# Patient Record
Sex: Female | Born: 1975 | Hispanic: No | Marital: Married | Smoking: Former smoker
Health system: Southern US, Community
[De-identification: ages and names within clinical notes are randomized; demographics above are authoritative.]

## PROBLEM LIST (undated history)

## (undated) ENCOUNTER — Emergency Department (HOSPITAL_COMMUNITY): Payer: Medicaid Other | Source: Home / Self Care

## (undated) ENCOUNTER — Emergency Department (HOSPITAL_COMMUNITY): Admission: EM | Payer: Medicaid Other | Source: Home / Self Care

## (undated) DIAGNOSIS — R519 Headache, unspecified: Secondary | ICD-10-CM

## (undated) DIAGNOSIS — K259 Gastric ulcer, unspecified as acute or chronic, without hemorrhage or perforation: Secondary | ICD-10-CM

## (undated) DIAGNOSIS — F419 Anxiety disorder, unspecified: Secondary | ICD-10-CM

## (undated) DIAGNOSIS — K219 Gastro-esophageal reflux disease without esophagitis: Secondary | ICD-10-CM

## (undated) DIAGNOSIS — K589 Irritable bowel syndrome without diarrhea: Secondary | ICD-10-CM

## (undated) DIAGNOSIS — R51 Headache: Secondary | ICD-10-CM

## (undated) DIAGNOSIS — M21619 Bunion of unspecified foot: Secondary | ICD-10-CM

## (undated) HISTORY — DX: Irritable bowel syndrome, unspecified: K58.9

## (undated) HISTORY — DX: Headache, unspecified: R51.9

## (undated) HISTORY — DX: Anxiety disorder, unspecified: F41.9

## (undated) HISTORY — DX: Bunion of unspecified foot: M21.619

## (undated) HISTORY — PX: FOOT SURGERY: SHX648

## (undated) HISTORY — DX: Headache: R51

---

## 2012-07-20 ENCOUNTER — Emergency Department (HOSPITAL_COMMUNITY): Payer: Medicaid Other

## 2012-07-20 ENCOUNTER — Encounter (HOSPITAL_COMMUNITY): Payer: Self-pay | Admitting: *Deleted

## 2012-07-20 ENCOUNTER — Emergency Department (HOSPITAL_COMMUNITY)
Admission: EM | Admit: 2012-07-20 | Discharge: 2012-07-20 | Disposition: A | Discharge status: Home / Self Care | Payer: Medicaid Other | Attending: Emergency Medicine | Admitting: Emergency Medicine

## 2012-07-20 DIAGNOSIS — R10812 Left upper quadrant abdominal tenderness: Secondary | ICD-10-CM | POA: Insufficient documentation

## 2012-07-20 DIAGNOSIS — R112 Nausea with vomiting, unspecified: Secondary | ICD-10-CM

## 2012-07-20 DIAGNOSIS — R109 Unspecified abdominal pain: Secondary | ICD-10-CM | POA: Insufficient documentation

## 2012-07-20 HISTORY — DX: Gastric ulcer, unspecified as acute or chronic, without hemorrhage or perforation: K25.9

## 2012-07-20 HISTORY — DX: Gastro-esophageal reflux disease without esophagitis: K21.9

## 2012-07-20 LAB — COMPREHENSIVE METABOLIC PANEL
AST: 10 U/L (ref 0–37)
Albumin: 3.2 g/dL — ABNORMAL LOW (ref 3.5–5.2)
BUN: 9 mg/dL (ref 6–23)
Calcium: 8.8 mg/dL (ref 8.4–10.5)
Chloride: 110 mEq/L (ref 96–112)
Creatinine, Ser: 0.77 mg/dL (ref 0.50–1.10)
GFR calc non Af Amer: >90 mL/min (ref >90)
Total Bilirubin: 0.5 mg/dL (ref 0.3–1.2)

## 2012-07-20 LAB — CBC WITH DIFFERENTIAL/PLATELET
Hemoglobin: 12 g/dL (ref 12.0–15.0)
Lymphocytes Relative: 49 % — ABNORMAL HIGH (ref 12–46)
Lymphs Abs: 4.7 10*3/uL — ABNORMAL HIGH (ref 0.7–4.0)
MCH: 31.6 pg (ref 26.0–34.0)
MCV: 89.7 fL (ref 78.0–100.0)
Monocytes Relative: 6 % (ref 3–12)
Neutrophils Relative %: 44 % (ref 43–77)
Platelets: 264 10*3/uL (ref 150–400)
RBC: 3.8 MIL/uL — ABNORMAL LOW (ref 3.87–5.11)
WBC: 9.6 10*3/uL (ref 4.0–10.5)

## 2012-07-20 LAB — URINALYSIS, ROUTINE W REFLEX MICROSCOPIC
Ketones, ur: NEGATIVE mg/dL
Leukocytes, UA: NEGATIVE
Protein, ur: NEGATIVE mg/dL
Urobilinogen, UA: 1 mg/dL (ref 0.0–1.0)

## 2012-07-20 LAB — POCT PREGNANCY, URINE: Preg Test, Ur: NEGATIVE

## 2012-07-20 LAB — LIPASE, BLOOD: Lipase: 25 U/L (ref 11–59)

## 2012-07-20 MED ORDER — IOHEXOL 300 MG/ML  SOLN
100.0000 mL | Freq: Once | INTRAMUSCULAR | Status: AC | PRN
  Administered 2012-07-20: 100 mL via INTRAVENOUS

## 2012-07-20 MED ORDER — ONDANSETRON 4 MG PO TBDP
8.0000 mg | ORAL_TABLET | Freq: Once | ORAL | Status: AC
  Administered 2012-07-20: 8 mg via ORAL
  Filled 2012-07-20: qty 2

## 2012-07-20 MED ORDER — SODIUM CHLORIDE 0.9 % IV BOLUS (SEPSIS)
1000.0000 mL | Freq: Once | INTRAVENOUS | Status: AC
  Administered 2012-07-20: 1000 mL via INTRAVENOUS

## 2012-07-20 MED ORDER — HYDROMORPHONE HCL PF 1 MG/ML IJ SOLN
1.0000 mg | Freq: Once | INTRAMUSCULAR | Status: AC
  Administered 2012-07-20: 1 mg via INTRAVENOUS
  Filled 2012-07-20: qty 1

## 2012-07-20 MED ORDER — IOHEXOL 300 MG/ML  SOLN
20.0000 mL | INTRAMUSCULAR | Status: AC
  Administered 2012-07-20: 20 mL via ORAL

## 2012-07-20 MED ORDER — ONDANSETRON HCL 8 MG PO TABS: 8.0000 mg | ORAL_TABLET | Freq: Three times a day (TID) | ORAL | Status: AC | PRN

## 2012-07-20 MED ORDER — OXYCODONE-ACETAMINOPHEN 5-325 MG PO TABS
1.0000 | ORAL_TABLET | Freq: Once | ORAL | Status: AC
  Administered 2012-07-20: 1 via ORAL
  Filled 2012-07-20: qty 1

## 2012-07-20 MED ORDER — ONDANSETRON HCL 4 MG/2ML IJ SOLN
4.0000 mg | Freq: Once | INTRAMUSCULAR | Status: AC
  Administered 2012-07-20: 4 mg via INTRAVENOUS
  Filled 2012-07-20: qty 2

## 2012-07-20 MED ORDER — OXYCODONE-ACETAMINOPHEN 5-325 MG PO TABS: 1.0000 | ORAL_TABLET | ORAL | Status: AC | PRN

## 2012-07-20 MED ORDER — DIPHENHYDRAMINE HCL 50 MG/ML IJ SOLN
25.0000 mg | Freq: Once | INTRAMUSCULAR | Status: AC
  Administered 2012-07-20: 25 mg via INTRAVENOUS
  Filled 2012-07-20: qty 1

## 2012-07-20 MED ORDER — SODIUM CHLORIDE 0.9 % IV SOLN
INTRAVENOUS | Status: DC
  Administered 2012-07-20: 04:00:00 via INTRAVENOUS

## 2012-07-20 NOTE — ED Provider Notes (Signed)
History     CSN: [id number]  Arrival date & time 07/20/12  0145   First MD Initiated Contact with Patient 07/20/12 [phone]      Chief Complaint  Patient presents with  . Abdominal Pain    (Consider location/radiation/quality/duration/timing/severity/associated sxs/prior treatment) HPI Comments: Rachael Moyer is a 36 y.o. Female who has had left upper abdominal pain, with nausea and vomiting, but no diarrhea, for 3 days. She's not tried medication for the problem. She's unable to tolerate any liquids or food. She's never had this before. She does have a history of constipation. She's not had a fever, chills, cough, shortness of breath, or chest pain. There has been no change in urinary habits. There are no other aggravating or palliative factors  Patient is a 36 y.o. female presenting with abdominal pain. The history is provided by the patient.  Abdominal Pain The primary symptoms of the illness include abdominal pain.    Past Medical History  Diagnosis Date  . Gastric ulcer   . GERD (gastroesophageal reflux disease)     History reviewed. No pertinent past surgical history.  History reviewed. No pertinent family history.  History  Substance Use Topics  . Smoking status: Current Everyday Smoker  . Smokeless tobacco: Not on file  . Alcohol Use: Yes    OB History    Grav Para Term Preterm Abortions TAB SAB Ect Mult Living                  Review of Systems  Gastrointestinal: Positive for abdominal pain.  All other systems reviewed and are negative.    Allergies  Review of patient's allergies indicates no known allergies.  Home Medications   Current Outpatient Rx  Name Route Sig Dispense Refill  . ONDANSETRON HCL 8 MG PO TABS Oral Take 1 tablet (8 mg total) by mouth every 8 (eight) hours as needed for nausea. 20 tablet 0  . OXYCODONE-ACETAMINOPHEN 5-325 MG PO TABS Oral Take 1 tablet by mouth every 4 (four) hours as needed for pain. 20 tablet 0    BP 107/71   Pulse 66  Temp 97 F (36.1 C) (Oral)  Resp 18  SpO2 100%  LMP 07/13/2012  Physical Exam  Nursing note and vitals reviewed. Constitutional: She is oriented to person, place, and time. She appears well-developed and well-nourished.  HENT:  Head: Normocephalic and atraumatic.  Eyes: Conjunctivae and EOM are normal. Pupils are equal, round, and reactive to light.  Neck: Normal range of motion and phonation normal. Neck supple.  Cardiovascular: Normal rate, regular rhythm and intact distal pulses.   Pulmonary/Chest: Effort normal and breath sounds normal. She exhibits no tenderness.  Abdominal: Soft. She exhibits no distension. There is tenderness (moderate left upper quadrant). There is no guarding.  Musculoskeletal: Normal range of motion.  Neurological: She is alert and oriented to person, place, and time. She has normal strength. She exhibits normal muscle tone.  Skin: Skin is warm and dry.  Psychiatric: She has a normal mood and affect. Her behavior is normal. Judgment and thought content normal.    ED Course  Procedures (including critical care time)  Emergency department treatment: IV fluids, IV, Dilaudid, and Zofran.  Reevaluation; 07:55- she has tolerated oral contrast without vomiting and feels somewhat better.  She would like another dose of medicine prior to leaving. She is given Percocet, and Zofran.  Labs Reviewed  URINALYSIS, ROUTINE W REFLEX MICROSCOPIC - Abnormal; Notable for the following:    APPearance CLOUDY (*)  Specific Gravity, Urine 1.031 (*)     All other components within normal limits  COMPREHENSIVE METABOLIC PANEL - Abnormal; Notable for the following:    Albumin 3.2 (*)     All other components within normal limits  CBC WITH DIFFERENTIAL - Abnormal; Notable for the following:    RBC 3.80 (*)     HCT 34.1 (*)     Lymphocytes Relative 49 (*)     Lymphs Abs 4.7 (*)     All other components within normal limits  POCT PREGNANCY, URINE  LIPASE,  BLOOD   Ct Abdomen Pelvis W Contrast  07/20/2012  *RADIOLOGY REPORT*  Clinical Data: Abdominal pain and vomiting.  CT ABDOMEN AND PELVIS WITH CONTRAST  Technique:  Multidetector CT imaging of the abdomen and pelvis was performed following the standard protocol during bolus administration of intravenous contrast.  Contrast: OMNIPAQUE IOHEXOL 300 MG/ML  SOLN  Comparison: No priors.  Findings:  Lung Bases: Minimal dependent subsegmental atelectasis in the lower lobes of the lungs bilaterally.  Otherwise, unremarkable.  Abdomen/Pelvis:  There is a subtle area of ill-defined decreased attenuation in segment 4B of the liver adjacent to the falciform ligament, favored to represent a small perfusion anomaly.  The remainder the liver is otherwise unremarkable in appearance.  The enhanced appearance of the gallbladder, pancreas, spleen and bilateral adrenal glands is unremarkable.  A 7 mm low attenuation lesion in left kidney is too small to definitively characterize. 12 x 18 mm low attenuation lesion in the upper pole of the right kidney is compatible with a cyst.  Trace volume of free fluid the cul-de-sac is presumably physiologic in this young female patient.  No larger volume of ascites.  No pneumoperitoneum.  No pathologic distension of bowel.  The appendix is normal.  Uterus and right ovary are unremarkable.  Multiple small follicles are incidentally noted in the left ovary.  Urinary bladder is unremarkable in appearance.  Musculoskeletal: There are no aggressive appearing lytic or blastic lesions noted in the visualized portions of the skeleton.  IMPRESSION: 1.  No definite acute findings in the abdomen or pelvis to account the patient's symptoms. 2.  Specifically, the appendix is normal. 3.  There is a ill-defined nonspecific low attenuation area in segment 4B of the liver adjacent the falciform ligament that is strongly favored to represent a benign perfusion anomaly.  The does not appear consistent with a  focal mass, and is highly unlikely to be related to the patient's acute symptoms.  This would require hepatic MRI with IV gadolinium for definitive characterization, however, this is not likely to be warranted. 3.  Low-attenuation renal lesions bilaterally.  Lesion on the right is compatible with a simple cyst, while the lesion on the left is too small to definitively characterize (but also likely to represent a cyst). 4.  Trace volume of free fluid the cul-de-sac is presumably physiologic in this young female patient.   Original Report Authenticated By: Florencia Reasons, M.D.      1. Abdominal pain of unknown etiology   2. Nausea & vomiting       MDM  Nonspecific abdominal pain, with pressure. A CT of the abdomen or pelvis. Doubt metabolic instability, serious bacterial infection or impending vascular collapse; the patient is stable for discharge.  She is instructed about the mild abnormality seen on CT scan testing that can be followed up as an outpatient with the Dr. of her choice.    Plan: Home Medications- Percocet and  Zofran; Home Treatments- gradually advance diet; Recommended follow up- PCP of choice 1-3 weks.     Flint Melter, MD 07/20/12 [phone]

## 2012-07-20 NOTE — ED Notes (Signed)
Patient currently sitting up in bed; no respiratory or acute distress noted.  Patient finished drinking contrast/water.  CT contacted and notified.  Patient has no other questions or concerns at this time; will continue to monitor.

## 2012-07-20 NOTE — ED Notes (Addendum)
Received bedside report from [place], [place].  Patient currently requesting pain medication; EDP aware.  Patient has no other questions or concerns at this time; will continue to monitor.

## 2012-07-20 NOTE — ED Notes (Signed)
Pt had her CT and results are in the chart.

## 2012-07-20 NOTE — ED Notes (Signed)
Pt states that 3 days ago she has been having abdominal pain and vomiting. Pt states she woke up today with more severe pain in her upper portion of her abdomen. Pt states that she ddi vomit after she woke up and has been attempting to eat broth and soup. Pt states she hasn't been to the bathroom in a week. Pt denies problems with urine

## 2012-07-20 NOTE — ED Notes (Signed)
Patient currently sitting up in bed; no respiratory or acute distress noted.  Patient still drinking contrast/water at this time; pending for CT.  Patient has no other questions or concerns at this time; will continue to monitor.

## 2013-01-24 ENCOUNTER — Emergency Department (HOSPITAL_COMMUNITY)
Admission: EM | Admit: 2013-01-24 | Discharge: 2013-01-24 | Disposition: A | Discharge status: Home / Self Care | Payer: Medicaid Other | Attending: Emergency Medicine | Admitting: Emergency Medicine

## 2013-01-24 ENCOUNTER — Emergency Department (HOSPITAL_COMMUNITY): Payer: Medicaid Other

## 2013-01-24 ENCOUNTER — Encounter (HOSPITAL_COMMUNITY): Payer: Self-pay | Admitting: Emergency Medicine

## 2013-01-24 DIAGNOSIS — R1310 Dysphagia, unspecified: Secondary | ICD-10-CM | POA: Insufficient documentation

## 2013-01-24 DIAGNOSIS — Z8711 Personal history of peptic ulcer disease: Secondary | ICD-10-CM | POA: Insufficient documentation

## 2013-01-24 DIAGNOSIS — J029 Acute pharyngitis, unspecified: Secondary | ICD-10-CM | POA: Insufficient documentation

## 2013-01-24 DIAGNOSIS — J209 Acute bronchitis, unspecified: Secondary | ICD-10-CM | POA: Insufficient documentation

## 2013-01-24 DIAGNOSIS — F17200 Nicotine dependence, unspecified, uncomplicated: Secondary | ICD-10-CM | POA: Insufficient documentation

## 2013-01-24 DIAGNOSIS — R059 Cough, unspecified: Secondary | ICD-10-CM | POA: Insufficient documentation

## 2013-01-24 DIAGNOSIS — R51 Headache: Secondary | ICD-10-CM | POA: Insufficient documentation

## 2013-01-24 DIAGNOSIS — R61 Generalized hyperhidrosis: Secondary | ICD-10-CM | POA: Insufficient documentation

## 2013-01-24 DIAGNOSIS — Z8719 Personal history of other diseases of the digestive system: Secondary | ICD-10-CM | POA: Insufficient documentation

## 2013-01-24 LAB — CBC WITH DIFFERENTIAL/PLATELET
Basophils Absolute: 0 10*3/uL (ref 0.0–0.1)
Basophils Relative: 0 % (ref 0–1)
Eosinophils Absolute: 0 10*3/uL (ref 0.0–0.7)
Eosinophils Relative: 0 % (ref 0–5)
HCT: 37.7 % (ref 36.0–46.0)
Hemoglobin: 13.8 g/dL (ref 12.0–15.0)
Lymphocytes Relative: 28 % (ref 12–46)
Lymphs Abs: 1.7 10*3/uL (ref 0.7–4.0)
MCH: 32.8 pg (ref 26.0–34.0)
MCHC: 36.6 g/dL — ABNORMAL HIGH (ref 30.0–36.0)
MCV: 89.5 fL (ref 78.0–100.0)
Monocytes Absolute: 0.8 10*3/uL (ref 0.1–1.0)
Monocytes Relative: 13 % — ABNORMAL HIGH (ref 3–12)
Neutro Abs: 3.6 10*3/uL (ref 1.7–7.7)
Neutrophils Relative %: 59 % (ref 43–77)
Platelets: 279 10*3/uL (ref 150–400)
RBC: 4.21 MIL/uL (ref 3.87–5.11)
RDW: 12 % (ref 11.5–15.5)
WBC: 6.1 10*3/uL (ref 4.0–10.5)

## 2013-01-24 LAB — POCT I-STAT, CHEM 8
BUN: 7 mg/dL (ref 6–23)
Calcium, Ion: 1.19 mmol/L (ref 1.12–1.23)
Chloride: 105 mEq/L (ref 96–112)
Creatinine, Ser: 0.9 mg/dL (ref 0.50–1.10)
Glucose, Bld: 88 mg/dL (ref 70–99)
HCT: 40 % (ref 36.0–46.0)
Hemoglobin: 13.6 g/dL (ref 12.0–15.0)
Potassium: 3.4 mEq/L — ABNORMAL LOW (ref 3.5–5.1)
Sodium: 141 mEq/L (ref 135–145)
TCO2: 26 mmol/L (ref 0–100)

## 2013-01-24 MED ORDER — AZITHROMYCIN 250 MG PO TABS: 250.0000 mg | ORAL_TABLET | Freq: Every day | ORAL | Status: DC

## 2013-01-24 MED ORDER — ALBUTEROL SULFATE HFA 108 (90 BASE) MCG/ACT IN AERS
2.0000 | INHALATION_SPRAY | Freq: Once | RESPIRATORY_TRACT | Status: AC
  Administered 2013-01-24: 2 via RESPIRATORY_TRACT
  Filled 2013-01-24: qty 6.7

## 2013-01-24 MED ORDER — BENZONATATE 100 MG PO CAPS: 200.0000 mg | ORAL_CAPSULE | Freq: Two times a day (BID) | ORAL | Status: DC | PRN

## 2013-01-24 NOTE — ED Provider Notes (Addendum)
History     CSN: [id number]  Arrival date & time 01/24/13  1348   First MD Initiated Contact with Patient 01/24/13 1601      Chief Complaint  Patient presents with  . Chest Pain  . Sore Throat    (Consider location/radiation/quality/duration/timing/severity/associated sxs/prior treatment) HPI Comments: 37 year old female who presents with her second and cough. She states that her symptoms started yesterday, gradually worsening, persistent, associated with diaphoresis, difficulty swallowing because of sore throat, headache, chest pain which feels like a tightness, worse when she breathes and coughs. She has recently started a new job at [employer], she does not have any other new sick contacts, has not recently been on antibiotics and otherwise is healthy without any significant medical problems. Nothing seems to make this better or worse  The history is provided by the patient.    Past Medical History  Diagnosis Date  . Gastric ulcer   . GERD (gastroesophageal reflux disease)     Past Surgical History  Procedure Laterality Date  . Foot surgery      No family history on file.  History  Substance Use Topics  . Smoking status: Current Every Day Smoker  . Smokeless tobacco: Not on file  . Alcohol Use: Yes    OB History   Grav Para Term Preterm Abortions TAB SAB Ect Mult Living                  Review of Systems  All other systems reviewed and are negative.    Allergies  Review of patient's allergies indicates no known allergies.  Home Medications   Current Outpatient Rx  Name  Route  Sig  Dispense  Refill  . Aspirin-Acetaminophen-Caffeine (EXCEDRIN PO)   Oral   Take 2 tablets by mouth daily as needed. For pain         . azithromycin (ZITHROMAX Z-PAK) 250 MG tablet   Oral   Take 1 tablet (250 mg total) by mouth daily. 500mg  PO day 1, then 250mg  PO days 205   6 tablet   0   . benzonatate (TESSALON) 100 MG capsule   Oral   Take 2 capsules  (200 mg total) by mouth 2 (two) times daily as needed for cough.   20 capsule   0     BP 109/83  Pulse 106  Temp(Src) 98.8 F (37.1 C) (Oral)  Resp 20  SpO2 97%  LMP 01/24/2013  Physical Exam  Nursing note and vitals reviewed. Constitutional: She appears well-developed and well-nourished. No distress.  HENT:  Head: Normocephalic and atraumatic.  Mouth/Throat: Oropharynx is clear and moist. No oropharyngeal exudate.  Eyes: Conjunctivae and EOM are normal. Pupils are equal, round, and reactive to light. Right eye exhibits no discharge. Left eye exhibits no discharge. No scleral icterus.  Neck: Normal range of motion. Neck supple. No JVD present. No thyromegaly present.  Cardiovascular: Normal rate, regular rhythm, normal heart sounds and intact distal pulses.  Exam reveals no gallop and no friction rub.   No murmur heard. Pulmonary/Chest: Effort normal and breath sounds normal. No respiratory distress. She has no wheezes. She has no rales.  Abdominal: Soft. Bowel sounds are normal. She exhibits no distension and no mass. There is no tenderness.  Musculoskeletal: Normal range of motion. She exhibits no edema and no tenderness.  Lymphadenopathy:    She has no cervical adenopathy.  Neurological: She is alert. Coordination normal.  Skin: Skin is warm and dry. No rash noted. No erythema.  Psychiatric: She has a normal mood and affect. Her behavior is normal.    ED Course  Procedures (including critical care time)  Labs Reviewed  CBC WITH DIFFERENTIAL - Abnormal; Notable for the following:    MCHC 36.6 (*)    Monocytes Relative 13 (*)    All other components within normal limits  POCT I-STAT, CHEM 8 - Abnormal; Notable for the following:    Potassium 3.4 (*)    All other components within normal limits  RAPID STREP SCREEN  POCT I-STAT TROPONIN I   Dg Chest 2 View  01/24/2013  *RADIOLOGY REPORT*  Clinical Data: Chest pain.  Shortness of breath.  CHEST - 2 VIEW  Comparison:   None.  Findings:  The heart size and mediastinal contours are within normal limits.  Both lungs are clear.  The visualized skeletal structures are unremarkable.  IMPRESSION: No active cardiopulmonary disease.   Original Report Authenticated By: Myles Rosenthal, M.D.      1. Bronchitis       MDM  There is no exudate asymmetry or hypertrophy of the pharynx, there is mild erythema, moist mucous membranes, clear heart and lung sounds, she is mildly tachycardic, her EKG is normal without any signs of ischemia I suspect chest pain is related to her underlying bronchitis.  Lab work shows the patient has normal blood counts, normal troponin, normal strep test as ordered by the nurses  PA and lateral views of the chest were obtained by digital radiography. I have personally interpreted these x-rays and find her to be no signs of pulmonary infiltrate, cardiomegaly, subdiaphragmatic free air, soft tissue abnormality, no obvious bony abnormalities or fractures.  The patient will be treated with albuterol MDI, Zithromax, Tessalon  ED ECG REPORT  I personally interpreted this EKG   Date: 01/24/2013   Rate: 113  Rhythm: sinus tachycardia  QRS Axis: normal  Intervals: normal  ST/T Wave abnormalities: normal  Conduction Disutrbances:none  Narrative Interpretation:   Old EKG Reviewed: none available       Vida Roller, MD 01/24/13 1630  Vida Roller, MD 01/24/13 [phone]

## 2013-01-24 NOTE — ED Notes (Signed)
Pt c/o sore throat onset yesterday with cough. Pt reports chest tightness onset today with shortness of breath, diaphoresis, n/v. Pt also c/o headache onset today.

## 2013-01-24 NOTE — ED Notes (Signed)
EKG already done in Triage. Given to Dr Linwood Dibbles.

## 2013-05-19 ENCOUNTER — Emergency Department (HOSPITAL_COMMUNITY)
Admission: EM | Admit: 2013-05-19 | Discharge: 2013-05-19 | Disposition: A | Discharge status: Home / Self Care | Payer: Medicaid Other | Attending: Emergency Medicine | Admitting: Emergency Medicine

## 2013-05-19 ENCOUNTER — Encounter (HOSPITAL_COMMUNITY): Payer: Self-pay | Admitting: Emergency Medicine

## 2013-05-19 DIAGNOSIS — K5900 Constipation, unspecified: Secondary | ICD-10-CM | POA: Insufficient documentation

## 2013-05-19 DIAGNOSIS — R110 Nausea: Secondary | ICD-10-CM | POA: Insufficient documentation

## 2013-05-19 DIAGNOSIS — F17200 Nicotine dependence, unspecified, uncomplicated: Secondary | ICD-10-CM | POA: Insufficient documentation

## 2013-05-19 DIAGNOSIS — K219 Gastro-esophageal reflux disease without esophagitis: Secondary | ICD-10-CM | POA: Insufficient documentation

## 2013-05-19 DIAGNOSIS — N390 Urinary tract infection, site not specified: Secondary | ICD-10-CM | POA: Insufficient documentation

## 2013-05-19 DIAGNOSIS — Z3202 Encounter for pregnancy test, result negative: Secondary | ICD-10-CM | POA: Insufficient documentation

## 2013-05-19 DIAGNOSIS — Z8719 Personal history of other diseases of the digestive system: Secondary | ICD-10-CM | POA: Insufficient documentation

## 2013-05-19 DIAGNOSIS — Z79899 Other long term (current) drug therapy: Secondary | ICD-10-CM | POA: Insufficient documentation

## 2013-05-19 DIAGNOSIS — M549 Dorsalgia, unspecified: Secondary | ICD-10-CM | POA: Insufficient documentation

## 2013-05-19 LAB — CBC WITH DIFFERENTIAL/PLATELET
Eosinophils Absolute: 0.1 10*3/uL (ref 0.0–0.7)
Eosinophils Relative: 2 % (ref 0–5)
Hemoglobin: 12.8 g/dL (ref 12.0–15.0)
Lymphs Abs: 3.3 10*3/uL (ref 0.7–4.0)
MCH: 32.1 pg (ref 26.0–34.0)
MCV: 89.5 fL (ref 78.0–100.0)
Monocytes Relative: 6 % (ref 3–12)
RBC: 3.99 MIL/uL (ref 3.87–5.11)

## 2013-05-19 LAB — BASIC METABOLIC PANEL
BUN: 8 mg/dL (ref 6–23)
Calcium: 8.6 mg/dL (ref 8.4–10.5)
GFR calc Af Amer: >90 mL/min (ref >90)
GFR calc non Af Amer: >90 mL/min (ref >90)
Glucose, Bld: 96 mg/dL (ref 70–99)

## 2013-05-19 LAB — HEPATIC FUNCTION PANEL
ALT: 8 U/L (ref 0–35)
AST: 10 U/L (ref 0–37)
Albumin: 3.2 g/dL — ABNORMAL LOW (ref 3.5–5.2)
Total Bilirubin: 0.5 mg/dL (ref 0.3–1.2)

## 2013-05-19 LAB — URINALYSIS, ROUTINE W REFLEX MICROSCOPIC
Glucose, UA: NEGATIVE mg/dL
Ketones, ur: NEGATIVE mg/dL
Nitrite: NEGATIVE
pH: 5.5 (ref 5.0–8.0)

## 2013-05-19 LAB — LIPASE, BLOOD: Lipase: 21 U/L (ref 11–59)

## 2013-05-19 LAB — URINE MICROSCOPIC-ADD ON

## 2013-05-19 LAB — POCT PREGNANCY, URINE: Preg Test, Ur: NEGATIVE

## 2013-05-19 MED ORDER — MORPHINE SULFATE 4 MG/ML IJ SOLN
4.0000 mg | Freq: Once | INTRAMUSCULAR | Status: AC
  Administered 2013-05-19: 4 mg via INTRAVENOUS
  Filled 2013-05-19: qty 1

## 2013-05-19 MED ORDER — PANTOPRAZOLE SODIUM 40 MG IV SOLR
40.0000 mg | INTRAVENOUS | Status: AC
  Administered 2013-05-19: 40 mg via INTRAVENOUS
  Filled 2013-05-19: qty 40

## 2013-05-19 MED ORDER — HYDROMORPHONE HCL PF 1 MG/ML IJ SOLN
1.0000 mg | Freq: Once | INTRAMUSCULAR | Status: AC
  Administered 2013-05-19: 1 mg via INTRAVENOUS
  Filled 2013-05-19: qty 1

## 2013-05-19 MED ORDER — ONDANSETRON 4 MG PO TBDP: 4.0000 mg | ORAL_TABLET | Freq: Three times a day (TID) | ORAL | Status: DC | PRN

## 2013-05-19 MED ORDER — DIPHENHYDRAMINE HCL 50 MG/ML IJ SOLN
25.0000 mg | Freq: Once | INTRAMUSCULAR | Status: AC
  Administered 2013-05-19: 25 mg via INTRAVENOUS
  Filled 2013-05-19: qty 1

## 2013-05-19 MED ORDER — GI COCKTAIL ~~LOC~~
30.0000 mL | Freq: Once | ORAL | Status: AC
  Administered 2013-05-19: 30 mL via ORAL
  Filled 2013-05-19: qty 30

## 2013-05-19 MED ORDER — TRAMADOL HCL 50 MG PO TABS: 50.0000 mg | ORAL_TABLET | Freq: Four times a day (QID) | ORAL | Status: DC | PRN

## 2013-05-19 MED ORDER — SODIUM CHLORIDE 0.9 % IV BOLUS (SEPSIS)
1000.0000 mL | Freq: Once | INTRAVENOUS | Status: AC
  Administered 2013-05-19: 1000 mL via INTRAVENOUS

## 2013-05-19 MED ORDER — CEPHALEXIN 500 MG PO CAPS: 500.0000 mg | ORAL_CAPSULE | Freq: Four times a day (QID) | ORAL | Status: DC

## 2013-05-19 MED ORDER — ONDANSETRON HCL 4 MG/2ML IJ SOLN
4.0000 mg | Freq: Once | INTRAMUSCULAR | Status: AC
  Administered 2013-05-19: 4 mg via INTRAVENOUS
  Filled 2013-05-19: qty 2

## 2013-05-19 NOTE — ED Notes (Signed)
Patient states has been having bilateral flank pain x 1 week.  Patient states has had abdominal pain x 2 months and abdomen is firm to touch.

## 2013-05-19 NOTE — ED Provider Notes (Signed)
History    CSN: [id number] Arrival date & time 05/19/13  4098  First MD Initiated Contact with Patient 05/19/13 [phone]     Chief Complaint  Patient presents with  . Back Pain  . Abdominal Pain   (Consider location/radiation/quality/duration/timing/severity/associated sxs/prior Treatment) HPI Comments: Patient is a 37 year old female with a past medical history of GERD who presents with a 2 month history of abdominal pain and 1 week history of bilateral flank pain. The pain is located in bilateral flanks and epigastric area and does not radiate. The pain is described as dull and severe. The pain started gradually and progressively worsened since the onset. No alleviating/aggravating factors. The patient has tried nothing for symptoms without relief. Associated symptoms include nausea and consitpation. Patient denies fever, headache, vomiting, diarrhea, chest pain, SOB, dysuria, abnormal vaginal bleeding/discharge.     Past Medical History  Diagnosis Date  . Gastric ulcer   . GERD (gastroesophageal reflux disease)    Past Surgical History  Procedure Laterality Date  . Foot surgery     No family history on file. History  Substance Use Topics  . Smoking status: Current Some Day Smoker    Types: Cigarettes  . Smokeless tobacco: Not on file  . Alcohol Use: Yes   OB History   Grav Para Term Preterm Abortions TAB SAB Ect Mult Living                 Review of Systems  Gastrointestinal: Positive for nausea and abdominal pain.  Genitourinary: Positive for flank pain.  All other systems reviewed and are negative.    Allergies  Review of patient's allergies indicates no known allergies.  Home Medications   Current Outpatient Rx  Name  Route  Sig  Dispense  Refill  . Aspirin-Acetaminophen-Caffeine (EXCEDRIN PO)   Oral   Take 2 tablets by mouth daily as needed. For pain         . Ranitidine HCl (ZANTAC 75 PO)   Oral   Take 1 tablet by mouth 2 (two) times daily.           BP 125/78  Pulse 69  Temp(Src) 98.4 F (36.9 C) (Oral)  Resp 18  Ht 5\' 4"  (1.626 m)  Wt 180 lb (81.647 kg)  BMI 30.88 kg/m2  SpO2 97%  LMP 04/26/2013 Physical Exam  Nursing note and vitals reviewed. Constitutional: She is oriented to person, place, and time. She appears well-developed and well-nourished. No distress.  HENT:  Head: Normocephalic and atraumatic.  Eyes: Conjunctivae and EOM are normal. Pupils are equal, round, and reactive to light. No scleral icterus.  Neck: Normal range of motion.  Cardiovascular: Normal rate and regular rhythm.  Exam reveals no gallop and no friction rub.   No murmur heard. Pulmonary/Chest: Effort normal and breath sounds normal. She has no wheezes. She has no rales. She exhibits no tenderness.  Abdominal: Soft. She exhibits no distension. There is tenderness. There is no rebound and no guarding.  Epigastric tenderness to palpation. No peritoneal signs or other focal tenderness to palpation.   Musculoskeletal: Normal range of motion.  Neurological: She is alert and oriented to person, place, and time. Coordination normal.  Speech is goal-oriented. Moves limbs without ataxia.   Skin: Skin is warm and dry.  Psychiatric: She has a normal mood and affect. Her behavior is normal.    ED Course  Procedures (including critical care time) Labs Reviewed  CBC WITH DIFFERENTIAL - Abnormal; Notable for the following:  HCT 35.7 (*)    All other components within normal limits  URINALYSIS, ROUTINE W REFLEX MICROSCOPIC - Abnormal; Notable for the following:    Color, Urine AMBER (*)    APPearance CLOUDY (*)    Leukocytes, UA SMALL (*)    All other components within normal limits  HEPATIC FUNCTION PANEL - Abnormal; Notable for the following:    Albumin 3.2 (*)    All other components within normal limits  URINE MICROSCOPIC-ADD ON - Abnormal; Notable for the following:    Squamous Epithelial / LPF MANY (*)    Bacteria, UA FEW (*)    All other  components within normal limits  URINE CULTURE  BASIC METABOLIC PANEL  LIPASE, BLOOD  POCT PREGNANCY, URINE   No results found. 1. UTI (urinary tract infection)     MDM  9:53 AM Labs pending. Patient will have morphine and zofran for symptoms. Patient afebrile with stable vitals.   1:11 PM Labs unremarkable. Urinalysis shows UTI. I will treat the patient with Keflex. Vitals stable and patient afebrile. Patient instructed to return with worsening or concerning symptoms.   Emilia Beck, PA-C 05/19/13 1314

## 2013-05-19 NOTE — ED Provider Notes (Signed)
Medical screening examination/treatment/procedure(s) were performed by non-physician practitioner and as supervising physician I was immediately available for consultation/collaboration.   Carleene Cooper III, MD 05/19/13 2124

## 2013-05-20 LAB — URINE CULTURE

## 2013-08-20 ENCOUNTER — Encounter: Payer: Self-pay | Admitting: *Deleted

## 2013-08-20 DIAGNOSIS — M21619 Bunion of unspecified foot: Secondary | ICD-10-CM | POA: Insufficient documentation

## 2013-09-01 ENCOUNTER — Encounter: Payer: Self-pay | Admitting: Podiatry

## 2014-02-24 ENCOUNTER — Telehealth: Payer: Self-pay | Admitting: *Deleted

## 2014-02-24 NOTE — Telephone Encounter (Signed)
I saw him about taking the screw out of my Bunion.  I would like to reschedule my appointment.  Please call me back.  I left her a message to call us back and schedule an appointment for a consultation with Dr. Al CorpusHyatt.

## 2014-03-02 ENCOUNTER — Ambulatory Visit: Payer: Self-pay | Admitting: Podiatry

## 2014-03-09 ENCOUNTER — Ambulatory Visit: Payer: Self-pay | Admitting: Podiatry

## 2014-04-26 ENCOUNTER — Encounter (HOSPITAL_COMMUNITY): Payer: Self-pay | Admitting: Emergency Medicine

## 2014-04-26 ENCOUNTER — Emergency Department (HOSPITAL_COMMUNITY)
Admission: EM | Admit: 2014-04-26 | Discharge: 2014-04-26 | Disposition: A | Discharge status: Home / Self Care | Payer: Medicaid Other | Attending: Emergency Medicine | Admitting: Emergency Medicine

## 2014-04-26 DIAGNOSIS — Z7982 Long term (current) use of aspirin: Secondary | ICD-10-CM | POA: Insufficient documentation

## 2014-04-26 DIAGNOSIS — F411 Generalized anxiety disorder: Secondary | ICD-10-CM | POA: Insufficient documentation

## 2014-04-26 DIAGNOSIS — F17200 Nicotine dependence, unspecified, uncomplicated: Secondary | ICD-10-CM | POA: Insufficient documentation

## 2014-04-26 DIAGNOSIS — Z79899 Other long term (current) drug therapy: Secondary | ICD-10-CM | POA: Insufficient documentation

## 2014-04-26 DIAGNOSIS — Z792 Long term (current) use of antibiotics: Secondary | ICD-10-CM | POA: Insufficient documentation

## 2014-04-26 DIAGNOSIS — K219 Gastro-esophageal reflux disease without esophagitis: Secondary | ICD-10-CM | POA: Insufficient documentation

## 2014-04-26 DIAGNOSIS — M7989 Other specified soft tissue disorders: Secondary | ICD-10-CM

## 2014-04-26 NOTE — ED Provider Notes (Signed)
CSN: [id number]     Arrival date & time 04/26/14  [id number] History   First MD Initiated Contact with Patient 04/26/14 0751     Chief Complaint  Patient presents with  . Finger Injury    Left ring finger     (Consider location/radiation/quality/duration/timing/severity/associated sxs/prior Treatment) HPI Comments: Pt states that she can't get her rings off her finger after getting married yesterday. States that there was no injury to the area. She has tried soap and water without relief.  The history is provided by the patient. No language interpreter was used.    Past Medical History  Diagnosis Date  . Gastric ulcer   . GERD (gastroesophageal reflux disease)   . Anxiety   . Irritable bowel   . Headache   . Bunion    Past Surgical History  Procedure Laterality Date  . Foot surgery     No family history on file. History  Substance Use Topics  . Smoking status: Current Some Day Smoker    Types: Cigarettes  . Smokeless tobacco: Not on file  . Alcohol Use: Yes   OB History   Grav Para Term Preterm Abortions TAB SAB Ect Mult Living                 Review of Systems  Constitutional: Negative.   Respiratory: Negative.   Cardiovascular: Negative.       Allergies  Review of patient's allergies indicates no known allergies.  Home Medications   Prior to Admission medications   Medication Sig Start Date End Date Taking? Authorizing Provider  Aspirin-Acetaminophen-Caffeine (EXCEDRIN PO) Take 2 tablets by mouth daily as needed. For pain    Historical Provider, MD  cephALEXin (KEFLEX) 500 MG capsule Take 1 capsule (500 mg total) by mouth 4 (four) times daily. 05/19/13   Kaitlyn Szekalski, PA-C  ondansetron (ZOFRAN ODT) 4 MG disintegrating tablet Take 1 tablet (4 mg total) by mouth every 8 (eight) hours as needed for nausea. 05/19/13   Kaitlyn Szekalski, PA-C  Ranitidine HCl (ZANTAC 75 PO) Take 1 tablet by mouth 2 (two) times daily.    Historical Provider, MD  traMADol (ULTRAM) 50  MG tablet Take 1 tablet (50 mg total) by mouth every 6 (six) hours as needed for pain. 05/19/13   Kaitlyn Szekalski, PA-C   BP 142/88  Temp(Src) 98.2 F (36.8 C) (Oral)  Resp 16  Ht 5\' 4"  (1.626 m)  SpO2 100%  LMP 04/26/2014 Physical Exam  Nursing note and vitals reviewed. Constitutional: She is oriented to person, place, and time. She appears well-developed and well-nourished.  Cardiovascular: Normal rate and regular rhythm.   Pulmonary/Chest: Effort normal and breath sounds normal.  Musculoskeletal:  Obvious swelling noted to the left ring finger. No redness or warmth to the area. Cap refill<2 sec  Neurological: She is alert and oriented to person, place, and time.  Skin: Skin is warm and dry.    ED Course  Procedures (including critical care time) Labs Review Labs Reviewed - No data to display  Imaging Review No results found.   EKG Interpretation None      MDM   Final diagnoses:  Finger swelling    Ring removed with ring cutter. Pt has full rom. No injury to there area don't think imaging is needed at this time    Teressa LowerVrinda Zakiyah Diop, NP 04/26/14 [phone]

## 2014-04-26 NOTE — ED Provider Notes (Signed)
  Medical screening examination/treatment/procedure(s) were performed by non-physician practitioner and as supervising physician I was immediately available for consultation/collaboration.    Gerhard Munchobert Delfino Friesen, MD 04/26/14 [phone]

## 2014-04-26 NOTE — ED Notes (Signed)
NP attempted to slide ring off with lube- unsuccessful. NP now has ring cutter at bedside

## 2014-04-26 NOTE — ED Notes (Signed)
Placed pt's finger in ice per verbal order.

## 2014-04-26 NOTE — ED Notes (Signed)
Pt reports that left ring finger began yesteday. Pt reports that her finger swells during the heat. Pt has a ring on her finger and is unable to get it off.

## 2014-05-12 ENCOUNTER — Emergency Department (HOSPITAL_COMMUNITY)
Admission: EM | Admit: 2014-05-12 | Discharge: 2014-05-12 | Disposition: A | Discharge status: Home / Self Care | Payer: Medicaid Other | Attending: Emergency Medicine | Admitting: Emergency Medicine

## 2014-05-12 ENCOUNTER — Emergency Department (HOSPITAL_COMMUNITY): Payer: Medicaid Other

## 2014-05-12 ENCOUNTER — Encounter (HOSPITAL_COMMUNITY): Payer: Self-pay | Admitting: Emergency Medicine

## 2014-05-12 DIAGNOSIS — F17200 Nicotine dependence, unspecified, uncomplicated: Secondary | ICD-10-CM | POA: Insufficient documentation

## 2014-05-12 DIAGNOSIS — K5900 Constipation, unspecified: Secondary | ICD-10-CM

## 2014-05-12 DIAGNOSIS — R109 Unspecified abdominal pain: Secondary | ICD-10-CM | POA: Diagnosis present

## 2014-05-12 DIAGNOSIS — N390 Urinary tract infection, site not specified: Secondary | ICD-10-CM | POA: Diagnosis not present

## 2014-05-12 DIAGNOSIS — Z8659 Personal history of other mental and behavioral disorders: Secondary | ICD-10-CM | POA: Diagnosis not present

## 2014-05-12 DIAGNOSIS — Z8739 Personal history of other diseases of the musculoskeletal system and connective tissue: Secondary | ICD-10-CM | POA: Diagnosis not present

## 2014-05-12 LAB — CBC WITH DIFFERENTIAL/PLATELET
BASOS ABS: 0 10*3/uL (ref 0.0–0.1)
BASOS PCT: 0 % (ref 0–1)
EOS ABS: 0.1 10*3/uL (ref 0.0–0.7)
EOS PCT: 1 % (ref 0–5)
HCT: 36.9 % (ref 36.0–46.0)
Hemoglobin: 12.4 g/dL (ref 12.0–15.0)
Lymphocytes Relative: 48 % — ABNORMAL HIGH (ref 12–46)
Lymphs Abs: 4.8 10*3/uL — ABNORMAL HIGH (ref 0.7–4.0)
MCH: 31.2 pg (ref 26.0–34.0)
MCHC: 33.6 g/dL (ref 30.0–36.0)
MCV: 92.9 fL (ref 78.0–100.0)
MONO ABS: 0.5 10*3/uL (ref 0.1–1.0)
Monocytes Relative: 5 % (ref 3–12)
Neutro Abs: 4.7 10*3/uL (ref 1.7–7.7)
Neutrophils Relative %: 46 % (ref 43–77)
PLATELETS: 303 10*3/uL (ref 150–400)
RBC: 3.97 MIL/uL (ref 3.87–5.11)
RDW: 12.3 % (ref 11.5–15.5)
WBC: 10.1 10*3/uL (ref 4.0–10.5)

## 2014-05-12 LAB — COMPREHENSIVE METABOLIC PANEL
ALT: 13 U/L (ref 0–35)
AST: 14 U/L (ref 0–37)
Albumin: 3.6 g/dL (ref 3.5–5.2)
Alkaline Phosphatase: 78 U/L (ref 39–117)
Anion gap: 12 (ref 5–15)
BUN: 10 mg/dL (ref 6–23)
CALCIUM: 9 mg/dL (ref 8.4–10.5)
CO2: 26 mEq/L (ref 19–32)
CREATININE: 0.79 mg/dL (ref 0.50–1.10)
Chloride: 102 mEq/L (ref 96–112)
GFR calc Af Amer: >90 mL/min (ref >90)
GFR calc non Af Amer: >90 mL/min (ref >90)
Glucose, Bld: 86 mg/dL (ref 70–99)
Potassium: 4 mEq/L (ref 3.7–5.3)
SODIUM: 140 meq/L (ref 137–147)
TOTAL PROTEIN: 7.2 g/dL (ref 6.0–8.3)
Total Bilirubin: 0.3 mg/dL (ref 0.3–1.2)

## 2014-05-12 LAB — URINALYSIS, ROUTINE W REFLEX MICROSCOPIC
Bilirubin Urine: NEGATIVE
GLUCOSE, UA: NEGATIVE mg/dL
Hgb urine dipstick: NEGATIVE
Ketones, ur: NEGATIVE mg/dL
LEUKOCYTES UA: NEGATIVE
NITRITE: POSITIVE — AB
PH: 5.5 (ref 5.0–8.0)
PROTEIN: NEGATIVE mg/dL
Specific Gravity, Urine: 1.024 (ref 1.005–1.030)
Urobilinogen, UA: 1 mg/dL (ref 0.0–1.0)

## 2014-05-12 LAB — URINE MICROSCOPIC-ADD ON

## 2014-05-12 LAB — LIPASE, BLOOD: LIPASE: 24 U/L (ref 11–59)

## 2014-05-12 MED ORDER — HYDROCODONE-ACETAMINOPHEN 5-325 MG PO TABS
1.0000 | ORAL_TABLET | Freq: Once | ORAL | Status: AC | PRN
  Administered 2014-05-12: 1 via ORAL
  Filled 2014-05-12: qty 1

## 2014-05-12 MED ORDER — DICYCLOMINE HCL 20 MG PO TABS: 20.0000 mg | ORAL_TABLET | Freq: Two times a day (BID) | ORAL | Status: DC

## 2014-05-12 MED ORDER — CEPHALEXIN 500 MG PO CAPS: 500.0000 mg | ORAL_CAPSULE | Freq: Four times a day (QID) | ORAL | Status: DC

## 2014-05-12 MED ORDER — MAGNESIUM CITRATE PO SOLN: 296.0000 mL | Freq: Once | ORAL | Status: DC

## 2014-05-12 NOTE — ED Notes (Signed)
Pt c/o mid abd pain and pain with urination x 2 days; pt denies vaginal discharge

## 2014-05-12 NOTE — ED Notes (Signed)
Assisted patient to the restroom without any difficulty or incident. 

## 2014-05-12 NOTE — Discharge Instructions (Signed)
Urinary Tract Infection Urinary tract infections (UTIs) can develop anywhere along your urinary tract. Your urinary tract is your body's drainage system for removing wastes and extra water. Your urinary tract includes two kidneys, two ureters, a bladder, and a urethra. Your kidneys are a pair of bean-shaped organs. Each kidney is about the size of your fist. They are located below your ribs, one on each side of your spine. CAUSES Infections are caused by microbes, which are microscopic organisms, including fungi, viruses, and bacteria. These organisms are so small that they can only be seen through a microscope. Bacteria are the microbes that most commonly cause UTIs. SYMPTOMS  Symptoms of UTIs may vary by age and gender of the patient and by the location of the infection. Symptoms in young women typically include a frequent and intense urge to urinate and a painful, burning feeling in the bladder or urethra during urination. Older women and men are more likely to be tired, shaky, and weak and have muscle aches and abdominal pain. A fever may mean the infection is in your kidneys. Other symptoms of a kidney infection include pain in your back or sides below the ribs, nausea, and vomiting. DIAGNOSIS To diagnose a UTI, your caregiver will ask you about your symptoms. Your caregiver also will ask to provide a urine sample. The urine sample will be tested for bacteria and white blood cells. White blood cells are made by your body to help fight infection. TREATMENT  Typically, UTIs can be treated with medication. Because most UTIs are caused by a bacterial infection, they usually can be treated with the use of antibiotics. The choice of antibiotic and length of treatment depend on your symptoms and the type of bacteria causing your infection. HOME CARE INSTRUCTIONS  If you were prescribed antibiotics, take them exactly as your caregiver instructs you. Finish the medication even if you feel better after you  have only taken some of the medication.  Drink enough water and fluids to keep your urine clear or pale yellow.  Avoid caffeine, tea, and carbonated beverages. They tend to irritate your bladder.  Empty your bladder often. Avoid holding urine for long periods of time.  Empty your bladder before and after sexual intercourse.  After a bowel movement, women should cleanse from front to back. Use each tissue only once. SEEK MEDICAL CARE IF:   You have back pain.  You develop a fever.  Your symptoms do not begin to resolve within 3 days. SEEK IMMEDIATE MEDICAL CARE IF:   You have severe back pain or lower abdominal pain.  You develop chills.  You have nausea or vomiting.  You have continued burning or discomfort with urination. MAKE SURE YOU:   Understand these instructions.  Will watch your condition.  Will get help right away if you are not doing well or get worse. Document Released: 08/08/2005 Document Revised: 04/29/2012 Document Reviewed: 12/07/2011 [facility] Patient Information 2015 [place], [place]. This information is not intended to replace advice given to you by your health care provider. Make sure you discuss any questions you have with your health care provider. Constipation Constipation is when a person has fewer than three bowel movements a week, has difficulty having a bowel movement, or has stools that are dry, hard, or larger than normal. As people grow older, constipation is more common. If you try to fix constipation with medicines that make you have a bowel movement (laxatives), the problem may get worse. Long-term laxative use may cause the muscles  of the colon to become weak. A low-fiber diet, not taking in enough fluids, and taking certain medicines may make constipation worse.  CAUSES   Certain medicines, such as antidepressants, pain medicine, iron supplements, antacids, and water pills.   Certain diseases, such as diabetes, irritable bowel syndrome (IBS),  thyroid disease, or depression.   Not drinking enough water.   Not eating enough fiber-rich foods.   Stress or travel.   Lack of physical activity or exercise.   Ignoring the urge to have a bowel movement.   Using laxatives too much.  SIGNS AND SYMPTOMS   Having fewer than three bowel movements a week.   Straining to have a bowel movement.   Having stools that are hard, dry, or larger than normal.   Feeling full or bloated.   Pain in the lower abdomen.   Not feeling relief after having a bowel movement.  DIAGNOSIS  Your health care provider will take a medical history and perform a physical exam. Further testing may be done for severe constipation. Some tests may include:  A barium enema X-ray to examine your rectum, colon, and, sometimes, your small intestine.   A sigmoidoscopy to examine your lower colon.   A colonoscopy to examine your entire colon. TREATMENT  Treatment will depend on the severity of your constipation and what is causing it. Some dietary treatments include drinking more fluids and eating more fiber-rich foods. Lifestyle treatments may include regular exercise. If these diet and lifestyle recommendations do not help, your health care provider may recommend taking over-the-counter laxative medicines to help you have bowel movements. Prescription medicines may be prescribed if over-the-counter medicines do not work.  HOME CARE INSTRUCTIONS   Eat foods that have a lot of fiber, such as fruits, vegetables, whole grains, and beans.  Limit foods high in fat and processed sugars, such as french fries, hamburgers, cookies, candies, and soda.   A fiber supplement may be added to your diet if you cannot get enough fiber from foods.   Drink enough fluids to keep your urine clear or pale yellow.   Exercise regularly or as directed by your health care provider.   Go to the restroom when you have the urge to go. Do not hold it.   Only take  over-the-counter or prescription medicines as directed by your health care provider. Do not take other medicines for constipation without talking to your health care provider first.  SEEK IMMEDIATE MEDICAL CARE IF:   You have bright red blood in your stool.   Your constipation lasts for more than 4 days or gets worse.   You have abdominal or rectal pain.   You have thin, pencil-like stools.   You have unexplained weight loss. MAKE SURE YOU:   Understand these instructions.  Will watch your condition.  Will get help right away if you are not doing well or get worse. Document Released: 07/27/2004 Document Revised: 11/03/2013 Document Reviewed: 08/10/2013 [facility] Patient Information 2015 [place], [place]. This information is not intended to replace advice given to you by your health care provider. Make sure you discuss any questions you have with your health care provider. Magnesium Citrate oral solution What is this medicine? MAGNESIUM CITRATE (mag NEE zee um SI treyt) is a saline laxative. It is used to treat occasional constipation, but it should not be used regularly for this purpose. This medicine may be used for other purposes; ask your health care provider or pharmacist if you have questions. COMMON BRAND NAME(S):  Citroma What should I tell my health care provider before I take this medicine? They need to know if you have any of these conditions: -are on a low magnesium or low sodium diet -change in bowel habits for 2 weeks -colostomy or ileostomy -constipation after using another laxative for 7 days -diabetes -kidney disease -rectal bleeding -stomach pain, nausea, or vomiting -an unusual or allergic reaction to magnesium citrate, other magnesium products, other medicines, foods, dyes, or preservatives -pregnant or trying to get pregnant -breast-feeding How should I use this medicine? Take this medicine by mouth. Follow the directions on the package or prescription  label. Use a specially marked spoon or container to measure each dose. Ask your pharmacist if you do not have one. Household spoons are not accurate. Drink a full glass of fluid with each dose of this medicine. This medicine may taste better if it is chilled before you drink it. Do not take your medicine more often than directed. Talk to your pediatrician regarding the use of this medicine in children. While this drug may be prescribed for children as young as 51 years of age for selected conditions, precautions do apply. Overdosage: If you think you have taken too much of this medicine contact a poison control center or emergency room at once. NOTE: This medicine is only for you. Do not share this medicine with others. What if I miss a dose? This does not apply; this medicine is not for regular use. What may interact with this medicine? -cellulose sodium phosphate -digoxin -edetate disodium, EDTA -medicines for bone strength like etidronate, ibandronate, risedronate -sodium polystyrene sulfonate -some antibiotics like ciprofloxacin, doxycycline, gatifloxacin, levofloxacin, tetracycline -vitamin D This list may not describe all possible interactions. Give your health care provider a list of all the medicines, herbs, non-prescription drugs, or dietary supplements you use. Also tell them if you smoke, drink alcohol, or use illegal drugs. Some items may interact with your medicine. What should I watch for while using this medicine? Tell your doctor or healthcare professional if your symptoms do not start to get better or if they get worse. Do not take any other medicine by mouth within 2 hours of taking this medicine. What side effects may I notice from receiving this medicine? Side effects that you should report to your doctor or health care professional as soon as possible: -allergic reactions like skin rash, itching or hives, swelling of the face, lips, or tongue -breathing problems -chest  pain -fast, irregular heartbeat -muscle weakness -nausea or vomiting Side effects that usually do not require medical attention (report to your doctor or health care professional if they continue or are bothersome): -diarrhea -stomach upset This list may not describe all possible side effects. Call your doctor for medical advice about side effects. You may report side effects to FDA at 1-800-FDA-1088. Where should I keep my medicine? Keep out of the reach of children. Store at room temperature or in the refrigerator between 8 and 30 degrees C (46 and 86 degrees F). Throw away any unused medicine 24 hours after opening the bottle. Throw away unopened bottles of medicine after the expiration date. NOTE: This sheet is a summary. It may not cover all possible information. If you have questions about this medicine, talk to your doctor, pharmacist, or health care provider.  2015, Elsevier/[employer] Standard. (2008-05-18 17:27:50) Cephalexin oral suspension What is this medicine? CEPHALEXIN (sef a LEX in) is a cephalosporin antibiotic. It is used to treat certain kinds of bacterial infections.It will not  work for colds, flu, or other viral infections. This medicine may be used for other purposes; ask your health care provider or pharmacist if you have questions. COMMON BRAND NAME(S): Biocef, Keflex, Panixine What should I tell my health care provider before I take this medicine? They need to know if you have any of these conditions: -kidney disease -stomach or intestine problems, especially colitis -an unusual or allergic reaction to cephalexin, other cephalosporins, penicillins, other antibiotics, medicines, foods, dyes or preservatives -pregnant or trying to get pregnant -breast-feeding How should I use this medicine? Take this medicine by mouth. Follow the directions on your prescription label. Shake well before using. Use a specially marked spoon or container to measure your medicine. Ask your  pharmacist if you do not have one. Household spoons are not accurate. You can take this medicine with food or on an empty stomach. If the medicine upsets your stomach, take it with food. Do not take your medicine more often than directed. Finish the full course prescribed by your doctor or health care professional even if you think your condition is better. Talk to your pediatrician regarding the use of this medicine in children. While this drug may be prescribed for selected conditions, precautions do apply. Overdosage: If you think you have taken too much of this medicine contact a poison control center or emergency room at once. NOTE: This medicine is only for you. Do not share this medicine with others. What if I miss a dose? If you miss a dose, take it as soon as you can. If it is almost time for your next dose, take only that dose. Do not take double or extra doses. There should be at least 4 to 6 hours between doses. What may interact with this medicine? -probenecid -some other antibiotics This list may not describe all possible interactions. Give your health care provider a list of all the medicines, herbs, non-prescription drugs, or dietary supplements you use. Also tell them if you smoke, drink alcohol, or use illegal drugs. Some items may interact with your medicine. What should I watch for while using this medicine? Tell your doctor or health care professional if your symptoms do not begin to improve in a few days. Do not treat diarrhea with over the counter products. Contact your doctor if you have diarrhea that lasts more than 2 days or if it is severe and watery. If you have diabetes, you may get a false-positive result for sugar in your urine. Check with your doctor or health care professional. What side effects may I notice from receiving this medicine? Side effects that you should report to your doctor or health care professional as soon as possible: -allergic reactions like skin  rash, itching or hives, swelling of the face, lips, or tongue -breathing problems -pain or difficulty passing urine -redness, blistering, peeling or loosening of the skin, including inside the mouth -severe or watery diarrhea -unusually weak or tired -yellowing of the eyes, skin Side effects that usually do not require medical attention (report to your doctor or health care professional if they continue or are bothersome): -gas or heartburn -genital or anal irritation -headache -joint or muscle pain -nausea, vomiting This list may not describe all possible side effects. Call your doctor for medical advice about side effects. You may report side effects to FDA at 1-800-FDA-1088. Where should I keep my medicine? Keep out of the reach of children. After this medicine is mixed by your pharmacist, store it in the refrigerator. Do not  freeze. Throw away any unused medicine after 14 days. NOTE: This sheet is a summary. It may not cover all possible information. If you have questions about this medicine, talk to your doctor, pharmacist, or health care provider.  2015, Elsevier/Gold Standard. (2008-02-02 17:10:55) Dicyclomine injection What is this medicine? DICYCLOMINE (dye SYE kloe meen) is used to treat bowel problems including irritable bowel syndrome. It is believed to be effective in reducing spasm of the bowel. This medicine may be used for other purposes; ask your health care provider or pharmacist if you have questions. COMMON BRAND NAME(S): Bentyl What should I tell my health care provider before I take this medicine? They need to know if you have any of these conditions: -difficulty passing urine -esophagus problems or heartburn -glaucoma -heart disease, or previous heart attack -myasthenia gravis -prostate trouble -stomach infection, or obstruction -ulcerative colitis -an unusual or allergic reaction to dicyclomine, other medicines, foods, dyes, or preservatives -pregnant or  trying to get pregnant -breast-feeding How should I use this medicine? This medicine is for injection into a muscle. It is given by a health care professional in a hospital or clinic setting. Talk to your pediatrician regarding the use of this medicine in children. Special care may be needed. Patients over 37 years old may have a stronger reaction and need a smaller dose. Overdosage: If you think you have taken too much of this medicine contact a poison control center or emergency room at once. NOTE: This medicine is only for you. Do not share this medicine with others. What if I miss a dose? This does not apply. What may interact with this medicine? -amantadine -benztropine -digoxin -disopyramide -metoclopramide -medicines for Alzheimer's disease -medicines for anxiety or sleeping problems -medicines for allergies, colds and breathing difficulties -medicines for depression or psychotic disturbances -medicines for diarrhea -medicines for pain -tegaserod This list may not describe all possible interactions. Give your health care provider a list of all the medicines, herbs, non-prescription drugs, or dietary supplements you use. Also tell them if you smoke, drink alcohol, or use illegal drugs. Some items may interact with your medicine. What should I watch for while using this medicine? Your condition will be monitored carefully while you are receiving this medicine. You may get drowsy, dizzy, or have blurred vision. Do not drive, use machinery, or do anything that needs mental alertness until you know how this medicine affects you. To reduce the risk of dizzy or fainting spells, do not sit or stand up quickly, especially if you are an older patient. Alcohol can make you more drowsy, avoid alcoholic drinks. Stay out of bright light and wear sunglasses if this medicine makes your eyes more sensitive to light. Avoid extreme heat (e.g., hot tubs, saunas). This medicine can cause you to sweat  less than normal. Your body temperature could increase to dangerous levels, which may lead to heat stroke. Your mouth may get dry. Chewing sugarless gum or sucking hard candy, and drinking plenty of water will help. What side effects may I notice from receiving this medicine? Side effects that you should report to your doctor or health care professional as soon as possible: -allergic reactions like skin rash, itching or hives, swelling of the face, lips, or tongue -agitation, nervousness, confusion -difficulty swallowing -dizziness, drowsiness -fast or slow heartbeat -hallucinations (seeing or hearing things that are not really there) -pain or difficulty passing urine Side effects that usually do not require medical attention (report to your doctor or health care professional if they  continue or are bothersome): -constipation -headache -nausea or vomiting -sexual difficulty (impotence) This list may not describe all possible side effects. Call your doctor for medical advice about side effects. You may report side effects to FDA at 1-800-FDA-1088. Where should I keep my medicine? This drug is given in a hospital or clinic and will not be stored at home. NOTE: This sheet is a summary. It may not cover all possible information. If you have questions about this medicine, talk to your doctor, pharmacist, or health care provider.  2015, Elsevier/Gold Standard. (2008-02-17 17:15:07)

## 2014-05-12 NOTE — ED Provider Notes (Signed)
CSN: [id number]     Arrival date & time 05/12/14  1652 History   First MD Initiated Contact with Patient 05/12/14 1914     Chief Complaint  Patient presents with  . Abdominal Pain  . Dysuria     (Consider location/radiation/quality/duration/timing/severity/associated sxs/prior Treatment) HPI  Rachael Moyer is a(n) 38 y.o. female who presents to the ED with cc of umbilical abdominal pain. She states it woke her from sleep this morning around 2 am. She pain is central, stent, nonradiating. She denies associated nausea, vomiting. Patient states she's not had a bowel movement for the past week. She states this seems to be normal for her. She states that she does make bowel movements it's very small. She denies any change in the color of her stools. She has not tried anything for constipation. She has no history of abdominal surgeries. Last menstrual period was 04/24/2014. She complains of intermittent tingling sensations with urination. She denies hematuria, dysuria or frequency of urination.   Past Medical History  Diagnosis Date  . Gastric ulcer   . GERD (gastroesophageal reflux disease)   . Anxiety   . Irritable bowel   . Headache   . Bunion    Past Surgical History  Procedure Laterality Date  . Foot surgery     History reviewed. No pertinent family history. History  Substance Use Topics  . Smoking status: Current Some Day Smoker    Types: Cigarettes  . Smokeless tobacco: Not on file  . Alcohol Use: Yes   OB History   Grav Para Term Preterm Abortions TAB SAB Ect Mult Living                 Review of Systems  Ten systems reviewed and are negative for acute change, except as noted in the HPI.    Allergies  Review of patient's allergies indicates no known allergies.  Home Medications   Prior to Admission medications   Not on File   BP 131/77  Pulse 63  Temp(Src) 97.9 F (36.6 C) (Oral)  Resp 14  SpO2 100%  LMP 04/26/2014 Physical Exam  Nursing note and vitals  reviewed. Constitutional: She is oriented to person, place, and time. She appears well-developed and well-nourished. No distress.  HENT:  Head: Normocephalic and atraumatic.  Eyes: Conjunctivae are normal. No scleral icterus.  Neck: Normal range of motion.  Cardiovascular: Normal rate, regular rhythm and normal heart sounds.  Exam reveals no gallop and no friction rub.   No murmur heard. Pulmonary/Chest: Effort normal and breath sounds normal. No respiratory distress.  Abdominal: Soft. Bowel sounds are normal. She exhibits no distension and no mass. There is tenderness (Diffuse). There is no guarding.  Neurological: She is alert and oriented to person, place, and time.  Skin: Skin is warm and dry. She is not diaphoretic.    ED Course  Procedures (including critical care time) Labs Review Labs Reviewed  CBC WITH DIFFERENTIAL - Abnormal; Notable for the following:    Lymphocytes Relative 48 (*)    Lymphs Abs 4.8 (*)    All other components within normal limits  URINALYSIS, ROUTINE W REFLEX MICROSCOPIC - Abnormal; Notable for the following:    Nitrite POSITIVE (*)    All other components within normal limits  URINE MICROSCOPIC-ADD ON - Abnormal; Notable for the following:    Squamous Epithelial / LPF FEW (*)    Bacteria, UA FEW (*)    All other components within normal limits  COMPREHENSIVE METABOLIC PANEL  LIPASE, BLOOD  POC URINE PREG, ED    Imaging Review No results found.   EKG Interpretation None      MDM   Final diagnoses:  UTI (lower urinary tract infection)  Constipation, unspecified constipation type    Patient labs unremarkable.  Diagnosed with  consitpaitn as evidenced by stool burden on xray and uti. D/c with keflex, bentyl, and mag citrate,. Return precautions discussed. Patient / Family / Caregiver informed of clinical course, understand medical decision-making process, and agree with plan.  I personally reviewed the imaging tests through PACS  system. I have reviewed and interpreted Lab values. I reviewed available ER/hospitalization records through the EMR     Arthor Captainbigail Tanayia Wahlquist, [place] JerseyPA-C 05/14/14 1153

## 2014-05-15 NOTE — ED Provider Notes (Signed)
Medical screening examination/treatment/procedure(s) were performed by non-physician practitioner and as supervising physician I was immediately available for consultation/collaboration.   EKG Interpretation None        Gilda Creasehristopher J. Amr Sturtevant, MD 05/15/14 [phone]

## 2014-11-18 ENCOUNTER — Emergency Department (HOSPITAL_COMMUNITY)
Admission: EM | Admit: 2014-11-18 | Discharge: 2014-11-18 | Disposition: A | Discharge status: Home / Self Care | Payer: Medicaid Other | Attending: Emergency Medicine | Admitting: Emergency Medicine

## 2014-11-18 ENCOUNTER — Encounter (HOSPITAL_COMMUNITY): Payer: Self-pay | Admitting: Emergency Medicine

## 2014-11-18 ENCOUNTER — Emergency Department (HOSPITAL_COMMUNITY): Payer: Medicaid Other

## 2014-11-18 DIAGNOSIS — Y998 Other external cause status: Secondary | ICD-10-CM | POA: Insufficient documentation

## 2014-11-18 DIAGNOSIS — Z8659 Personal history of other mental and behavioral disorders: Secondary | ICD-10-CM | POA: Insufficient documentation

## 2014-11-18 DIAGNOSIS — S60211A Contusion of right wrist, initial encounter: Secondary | ICD-10-CM | POA: Insufficient documentation

## 2014-11-18 DIAGNOSIS — Z720 Tobacco use: Secondary | ICD-10-CM | POA: Insufficient documentation

## 2014-11-18 DIAGNOSIS — S6991XA Unspecified injury of right wrist, hand and finger(s), initial encounter: Secondary | ICD-10-CM

## 2014-11-18 DIAGNOSIS — T1490XA Injury, unspecified, initial encounter: Secondary | ICD-10-CM

## 2014-11-18 DIAGNOSIS — Y9289 Other specified places as the place of occurrence of the external cause: Secondary | ICD-10-CM | POA: Insufficient documentation

## 2014-11-18 DIAGNOSIS — W19XXXA Unspecified fall, initial encounter: Secondary | ICD-10-CM

## 2014-11-18 DIAGNOSIS — Z8739 Personal history of other diseases of the musculoskeletal system and connective tissue: Secondary | ICD-10-CM | POA: Insufficient documentation

## 2014-11-18 DIAGNOSIS — Z8719 Personal history of other diseases of the digestive system: Secondary | ICD-10-CM | POA: Insufficient documentation

## 2014-11-18 DIAGNOSIS — Y9389 Activity, other specified: Secondary | ICD-10-CM | POA: Insufficient documentation

## 2014-11-18 DIAGNOSIS — W109XXA Fall (on) (from) unspecified stairs and steps, initial encounter: Secondary | ICD-10-CM | POA: Insufficient documentation

## 2014-11-18 MED ORDER — HYDROCODONE-ACETAMINOPHEN 5-325 MG PO TABS: 1.0000 | ORAL_TABLET | ORAL | Status: DC | PRN

## 2014-11-18 MED ORDER — HYDROCODONE-ACETAMINOPHEN 5-325 MG PO TABS
2.0000 | ORAL_TABLET | Freq: Once | ORAL | Status: AC
  Administered 2014-11-18: 2 via ORAL
  Filled 2014-11-18: qty 2

## 2014-11-18 NOTE — ED Provider Notes (Signed)
CSN: [id number]     Arrival date & time 11/18/14  1937 History   First MD Initiated Contact with Patient 11/18/14 2041     Chief Complaint  Patient presents with  . Wrist Injury     (Consider location/radiation/quality/duration/timing/severity/associated sxs/prior Treatment) HPI Comments: Patient is a 39 year old female who presents with right wrist pain that started today after she fell down the stairs. Patient reports when she slipped, her right wrist became stuck in the railing and twisted as she fell. The pain is throbbing and severe without radiation. She reports associated swelling and bruising. Movement and palpation makes the pain worse. No head trauma or LOC. She did not try anything for symptom relief. No other injury.    Past Medical History  Diagnosis Date  . Gastric ulcer   . GERD (gastroesophageal reflux disease)   . Anxiety   . Irritable bowel   . Headache   . Bunion    Past Surgical History  Procedure Laterality Date  . Foot surgery     No family history on file. History  Substance Use Topics  . Smoking status: Current Some Day Smoker    Types: Cigarettes  . Smokeless tobacco: Not on file  . Alcohol Use: Yes   OB History    No data available     Review of Systems  Constitutional: Negative for fever, chills and fatigue.  HENT: Negative for trouble swallowing.   Eyes: Negative for visual disturbance.  Respiratory: Negative for shortness of breath.   Cardiovascular: Negative for chest pain and palpitations.  Gastrointestinal: Negative for nausea, vomiting, abdominal pain and diarrhea.  Genitourinary: Negative for dysuria and difficulty urinating.  Musculoskeletal: Positive for joint swelling and arthralgias. Negative for neck pain.  Skin: Negative for color change.  Neurological: Negative for dizziness and weakness.  Psychiatric/Behavioral: Negative for dysphoric mood.      Allergies  Review of patient's allergies indicates no known  allergies.  Home Medications   Prior to Admission medications   Medication Sig Start Date End Date Taking? Authorizing Provider  cephALEXin (KEFLEX) 500 MG capsule Take 1 capsule (500 mg total) by mouth 4 (four) times daily. 05/12/14   Arthor Captain, PA-C  dicyclomine (BENTYL) 20 MG tablet Take 1 tablet (20 mg total) by mouth 2 (two) times daily. 05/12/14   Arthor Captain, PA-C  magnesium citrate solution Take 296 mLs by mouth once. OTC 05/12/14   Abigail Harris, PA-C   BP 136/90 mmHg  Pulse 75  Temp(Src) 98.6 F (37 C) (Oral)  Resp 16  Ht  (1.626 m)  Wt 185 lb (83.915 kg)  BMI 31.74 kg/m2  SpO2 100%  LMP 11/14/2014 Physical Exam  Constitutional: She is oriented to person, place, and time. She appears well-developed and well-nourished. No distress.  HENT:  Head: Normocephalic and atraumatic.  Eyes: Conjunctivae are normal.  Neck: Normal range of motion.  Cardiovascular: Normal rate and regular rhythm.  Exam reveals no gallop and no friction rub.   No murmur heard. Sufficient capillary refill of right distal fingers.   Pulmonary/Chest: Effort normal and breath sounds normal. She has no wheezes. She has no rales. She exhibits no tenderness.  Abdominal: Soft. There is no tenderness.  Musculoskeletal:  Limited ROM of right wrist due to pain. Generalized tenderness to palpation of the right wrist most noted at the ulnar aspect. Generalized swelling. No obvious deformity. Patient is able to move fingers of the right hand. No snuff box tenderness.   Neurological: She  is alert and oriented to person, place, and time. Coordination normal.  Speech is goal-oriented. Moves limbs without ataxia.   Skin: Skin is warm and dry.  Bruising noted at the ulnar aspect of the right wrist. No open wounds.   Psychiatric: She has a normal mood and affect. Her behavior is normal.  Nursing note and vitals reviewed.   ED Course  Procedures (including critical care time) Labs Review Labs Reviewed - No  data to display  SPLINT APPLICATION Date/Time: 9:41 PM Authorized by: Emilia BeckKaitlyn Brason Berthelot Consent: Verbal consent obtained. Risks and benefits: risks, benefits and alternatives were discussed Consent given by: patient Splint applied by: nurse Location details: right arm Splint type: sling Supplies used: sling Post-procedure: The splinted body part was neurovascularly unchanged following the procedure. Patient tolerance: Patient tolerated the procedure well with no immediate complications.     Imaging Review Dg Wrist Complete Right  11/18/2014   CLINICAL DATA:  Status post fall; landed on right wrist, with bruising and swelling. Initial encounter.  EXAM: RIGHT WRIST - COMPLETE 3+ VIEW  COMPARISON:  None.  FINDINGS: There is no evidence of fracture or dislocation. The carpal rows are intact, and demonstrate normal alignment. The joint spaces are preserved.  Mild dorsal soft tissue swelling is noted at the wrist.  IMPRESSION: No evidence of fracture or dislocation.   Electronically Signed   By: Roanna RaiderJeffery  Chang M.D.   On: 11/18/2014 20:56     EKG Interpretation None      MDM   Final diagnoses:  Fall, initial encounter  Right wrist injury, initial encounter   9:36 PM Patient's xray shows no fracture or dislocation. No neurovascular compromise. No other injuries. Patient will have a sling for comfort. Patient advised to rest, ice, and elevate right wrist for pain relief. Vitals stable and patient afebrile.     Emilia BeckKaitlyn Chukwudi Ewen, PA-C 11/19/14 0240  Raeford RazorStephen Kohut, MD 11/19/14 [phone]

## 2014-11-18 NOTE — ED Notes (Signed)
Pt. slipped and fell at home today , no LOC / ambulatory , presents with pain / swelling at right wrist.

## 2014-11-18 NOTE — ED Notes (Signed)
Declined W/C at D/C and was escorted to lobby by RN. 

## 2014-11-18 NOTE — Discharge Instructions (Signed)
Take Vicodin as needed for pain. Rest, ice, and elevate your right wrist. Refer to attached documents for more information.

## 2015-05-18 ENCOUNTER — Emergency Department (HOSPITAL_COMMUNITY)
Admission: EM | Admit: 2015-05-18 | Discharge: 2015-05-18 | Disposition: A | Discharge status: Home / Self Care | Payer: Medicaid Other | Attending: Emergency Medicine | Admitting: Emergency Medicine

## 2015-05-18 ENCOUNTER — Encounter (HOSPITAL_COMMUNITY): Payer: Self-pay | Admitting: Physical Medicine and Rehabilitation

## 2015-05-18 DIAGNOSIS — L237 Allergic contact dermatitis due to plants, except food: Secondary | ICD-10-CM | POA: Insufficient documentation

## 2015-05-18 DIAGNOSIS — Z79899 Other long term (current) drug therapy: Secondary | ICD-10-CM | POA: Insufficient documentation

## 2015-05-18 DIAGNOSIS — Z8739 Personal history of other diseases of the musculoskeletal system and connective tissue: Secondary | ICD-10-CM | POA: Insufficient documentation

## 2015-05-18 DIAGNOSIS — K589 Irritable bowel syndrome without diarrhea: Secondary | ICD-10-CM | POA: Insufficient documentation

## 2015-05-18 DIAGNOSIS — Z8659 Personal history of other mental and behavioral disorders: Secondary | ICD-10-CM | POA: Insufficient documentation

## 2015-05-18 DIAGNOSIS — K219 Gastro-esophageal reflux disease without esophagitis: Secondary | ICD-10-CM | POA: Insufficient documentation

## 2015-05-18 DIAGNOSIS — Z720 Tobacco use: Secondary | ICD-10-CM | POA: Insufficient documentation

## 2015-05-18 DIAGNOSIS — Z792 Long term (current) use of antibiotics: Secondary | ICD-10-CM | POA: Insufficient documentation

## 2015-05-18 MED ORDER — FAMOTIDINE 20 MG PO TABS: 20.0000 mg | ORAL_TABLET | Freq: Two times a day (BID) | ORAL | Status: DC

## 2015-05-18 MED ORDER — PREDNISONE 50 MG PO TABS: ORAL_TABLET | ORAL | Status: DC

## 2015-05-18 MED ORDER — LORATADINE 10 MG PO TABS: 10.0000 mg | ORAL_TABLET | Freq: Every day | ORAL | Status: DC

## 2015-05-18 NOTE — Discharge Instructions (Signed)
Continue to take the Benadryl as needed.

## 2015-05-18 NOTE — ED Notes (Signed)
Pt verbalizes understanding of d/c instructions and denies any further need at this time. 

## 2015-05-18 NOTE — ED Notes (Addendum)
Pt presents to department for evaluation of rash to bilateral arms and face. Thinks this could be poison ivy. Reports severe itching, no relief with OTC cream.

## 2015-05-18 NOTE — ED Provider Notes (Signed)
CSN: [id number]     Arrival date & time 05/18/15  1438 History  HPI Comments: This chart was scribed for non-physician practitioner,Judd Mccubbin M. Damian Leavell, NP, working with Louie Bun, MD, by Budd Palmer ED Scribe. This patient was seen in room TR05C/TR05C and the patient's care was started at 2:58 PM   Chief Complaint  Patient presents with  . Rash   Patient is a 39 y.o. female presenting with rash. The history is provided by the patient. No language interpreter was used.  Rash Location:  Head/neck, face, hand and shoulder/arm Head/neck rash location:  Scalp Facial rash location:  Face Hand rash location:  L palm, R palm, L hand and R hand Severity:  Moderate Onset quality:  Gradual Duration:  1 day Context: animal contact, plant contact and pollen   Relieved by:  Antihistamines Associated symptoms: diarrhea   Associated symptoms: no abdominal pain, no fever and not vomiting    HPI Comments: Rachael Moyer is a 39 y.o. female who presents to the Emergency Department complaining of an itchy, worsening, spreading rash to the arms and face onset 1 day ago. Initially pt reports it was only on the hands and neck, today it has spread to the  face and arms. She reports diarrhea this morning but no vomiting. 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 She has not spent much time outside. She denies using any new detergents, soaps, or foods. She has a dog that lives primarily inside, but does go out. The dog had fleas a week ago that were just gotten rid of. She reports having poison ivy and oak outside of the home in the area that she walk going to her car. She has been applying Calamine lotion with mild relief and taking benadryl with effective relief of the iching.  Past Medical History  Diagnosis Date  . Gastric ulcer   . GERD (gastroesophageal reflux disease)   . Anxiety   . Irritable bowel   . Headache   . Bunion    Past Surgical History  Procedure  Laterality Date  . Foot surgery     No family history on file. History  Substance Use Topics  . Smoking status: Current Some Day Smoker    Types: Cigarettes  . Smokeless tobacco: Not on file  . Alcohol Use: Yes   OB History    No data available     Review of Systems  Constitutional: Negative for fever.  Gastrointestinal: Positive for diarrhea. Negative for vomiting and abdominal pain.  Skin: Positive for rash (itchy).  All other systems reviewed and are negative.   Allergies  Review of patient's allergies indicates no known allergies.  Home Medications   Prior to Admission medications   Medication Sig Start Date End Date Taking? Authorizing Provider  cephALEXin (KEFLEX) 500 MG capsule Take 1 capsule (500 mg total) by mouth 4 (four) times daily. 05/12/14   Arthor Captain, PA-C  dicyclomine (BENTYL) 20 MG tablet Take 1 tablet (20 mg total) by mouth 2 (two) times daily. 05/12/14   Arthor Captain, PA-C  famotidine (PEPCID) 20 MG tablet Take 1 tablet (20 mg total) by mouth 2 (two) times daily. 05/18/15   Nohemy Koop Orlene Och, NP  HYDROcodone-acetaminophen (NORCO/VICODIN) 5-325 MG per tablet Take 1-2 tablets by mouth every 4 (four) hours as needed for moderate pain or severe pain. 11/18/14   Kaitlyn Szekalski, PA-C  loratadine (CLARITIN) 10 MG tablet Take 1 tablet (10 mg total) by  mouth daily. 05/18/15   Delrick Dehart Orlene OchM Kesean Serviss, NP  magnesium citrate solution Take 296 mLs by mouth once. OTC 05/12/14   Arthor CaptainAbigail Harris, PA-C  predniSONE (DELTASONE) 50 MG tablet Take one tablet daily. 05/18/15   Anais Koenen Orlene OchM Jerri Glauser, NP   BP 136/90 mmHg  Pulse 94  Temp(Src) 98.3 F (36.8 C) (Oral)  Resp 20  SpO2 100% Physical Exam  Constitutional: She is oriented to person, place, and time. She appears well-developed and well-nourished. No distress.  HENT:  Head: Normocephalic and atraumatic.  Mouth/Throat: Uvula is midline, oropharynx is clear and moist and mucous membranes are normal.  Eyes: Conjunctivae and EOM are normal. Pupils  are equal, round, and reactive to light.  Neck: Normal range of motion. Neck supple. No tracheal deviation present.  Cardiovascular: Normal rate, regular rhythm and normal heart sounds.   Pulmonary/Chest: Effort normal and breath sounds normal. No respiratory distress. She has no wheezes. She has no rales.  Abdominal: Soft. There is no tenderness.  Musculoskeletal: Normal range of motion.  Neurological: She is alert and oriented to person, place, and time.  Skin: Skin is warm and dry. Rash noted.  Linear vesicular areas to right side of neck and bilateral forearms consistent with contact dermatitis  Psychiatric: She has a normal mood and affect. Her behavior is normal.  Nursing note and vitals reviewed.   ED Course  Procedures  DIAGNOSTIC STUDIES: Oxygen Saturation is 100% on RA, normal by my interpretation.    COORDINATION OF CARE: 3:05 PM - Discussed plans to order claritin and steroids. Advised to continue with creme and benadryl. Pt advised of plan for treatment and pt agrees.   MDM  39 y.o. female with rash and itching that started one day ago. Stable for d/c without difficulty swallowing and no respiratory distress O2 SAT 100% on R/A. Will start prednisone, Claritin and she will continue Benadryl as needed and Calamine lotion. Discussed with the patient and all questioned fully answered. She will return if any problems arise.  Final diagnoses:  Contact dermatitis due to poison ivy   I personally performed the services described in this documentation, which was scribed in my presence. The recorded information has been reviewed and is accurate.    [address]Seena Ritacco StanleyM Cass Edinger, NP 05/18/15 1537  Margarita Grizzleanielle Ray, MD 05/18/15 [phone]

## 2015-08-26 ENCOUNTER — Encounter (HOSPITAL_COMMUNITY): Payer: Self-pay | Admitting: Emergency Medicine

## 2015-08-26 ENCOUNTER — Emergency Department (HOSPITAL_COMMUNITY)
Admission: EM | Admit: 2015-08-26 | Discharge: 2015-08-26 | Disposition: A | Discharge status: Home / Self Care | Payer: Self-pay | Attending: Emergency Medicine | Admitting: Emergency Medicine

## 2015-08-26 ENCOUNTER — Emergency Department (HOSPITAL_COMMUNITY): Payer: Medicaid Other

## 2015-08-26 DIAGNOSIS — K259 Gastric ulcer, unspecified as acute or chronic, without hemorrhage or perforation: Secondary | ICD-10-CM | POA: Insufficient documentation

## 2015-08-26 DIAGNOSIS — R109 Unspecified abdominal pain: Secondary | ICD-10-CM

## 2015-08-26 DIAGNOSIS — Z8659 Personal history of other mental and behavioral disorders: Secondary | ICD-10-CM | POA: Insufficient documentation

## 2015-08-26 DIAGNOSIS — Z8739 Personal history of other diseases of the musculoskeletal system and connective tissue: Secondary | ICD-10-CM | POA: Insufficient documentation

## 2015-08-26 DIAGNOSIS — R1013 Epigastric pain: Secondary | ICD-10-CM | POA: Insufficient documentation

## 2015-08-26 DIAGNOSIS — K219 Gastro-esophageal reflux disease without esophagitis: Secondary | ICD-10-CM | POA: Insufficient documentation

## 2015-08-26 DIAGNOSIS — Z720 Tobacco use: Secondary | ICD-10-CM | POA: Insufficient documentation

## 2015-08-26 DIAGNOSIS — R112 Nausea with vomiting, unspecified: Secondary | ICD-10-CM | POA: Insufficient documentation

## 2015-08-26 LAB — COMPREHENSIVE METABOLIC PANEL
ALBUMIN: 3.7 g/dL (ref 3.5–5.0)
ALT: 12 U/L — ABNORMAL LOW (ref 14–54)
ANION GAP: 6 (ref 5–15)
AST: 17 U/L (ref 15–41)
Alkaline Phosphatase: 72 U/L (ref 38–126)
BILIRUBIN TOTAL: 1.4 mg/dL — AB (ref 0.3–1.2)
BUN: 7 mg/dL (ref 6–20)
CHLORIDE: 104 mmol/L (ref 101–111)
CO2: 27 mmol/L (ref 22–32)
Calcium: 9.5 mg/dL (ref 8.9–10.3)
Creatinine, Ser: 0.8 mg/dL (ref 0.44–1.00)
GFR calc Af Amer: >60 mL/min (ref >60)
GFR calc non Af Amer: >60 mL/min (ref >60)
GLUCOSE: 97 mg/dL (ref 65–99)
POTASSIUM: 3.7 mmol/L (ref 3.5–5.1)
Sodium: 137 mmol/L (ref 135–145)
TOTAL PROTEIN: 7.2 g/dL (ref 6.5–8.1)

## 2015-08-26 LAB — URINALYSIS, ROUTINE W REFLEX MICROSCOPIC
Bilirubin Urine: NEGATIVE
Glucose, UA: NEGATIVE mg/dL
HGB URINE DIPSTICK: NEGATIVE
Ketones, ur: NEGATIVE mg/dL
Nitrite: NEGATIVE
PH: 5.5 (ref 5.0–8.0)
Protein, ur: NEGATIVE mg/dL
SPECIFIC GRAVITY, URINE: 1.014 (ref 1.005–1.030)
UROBILINOGEN UA: 0.2 mg/dL (ref 0.0–1.0)

## 2015-08-26 LAB — URINE MICROSCOPIC-ADD ON

## 2015-08-26 LAB — CBC
HEMATOCRIT: 40 % (ref 36.0–46.0)
HEMOGLOBIN: 13.6 g/dL (ref 12.0–15.0)
MCH: 31.6 pg (ref 26.0–34.0)
MCHC: 34 g/dL (ref 30.0–36.0)
MCV: 92.8 fL (ref 78.0–100.0)
Platelets: 335 10*3/uL (ref 150–400)
RBC: 4.31 MIL/uL (ref 3.87–5.11)
RDW: 12.4 % (ref 11.5–15.5)
WBC: 9.6 10*3/uL (ref 4.0–10.5)

## 2015-08-26 LAB — I-STAT BETA HCG BLOOD, ED (MC, WL, AP ONLY)

## 2015-08-26 LAB — LIPASE, BLOOD: Lipase: 20 U/L — ABNORMAL LOW (ref 22–51)

## 2015-08-26 MED ORDER — HYDROCODONE-ACETAMINOPHEN 5-325 MG PO TABS: 1.0000 | ORAL_TABLET | ORAL | Status: DC | PRN

## 2015-08-26 MED ORDER — ONDANSETRON HCL 4 MG/2ML IJ SOLN
4.0000 mg | Freq: Once | INTRAMUSCULAR | Status: AC
  Administered 2015-08-26: 4 mg via INTRAVENOUS
  Filled 2015-08-26: qty 2

## 2015-08-26 MED ORDER — DIPHENHYDRAMINE HCL 25 MG PO CAPS
25.0000 mg | ORAL_CAPSULE | Freq: Once | ORAL | Status: AC
  Administered 2015-08-26: 25 mg via ORAL
  Filled 2015-08-26: qty 1

## 2015-08-26 MED ORDER — MORPHINE SULFATE (PF) 4 MG/ML IV SOLN
4.0000 mg | Freq: Once | INTRAVENOUS | Status: AC
  Administered 2015-08-26: 4 mg via INTRAVENOUS
  Filled 2015-08-26: qty 1

## 2015-08-26 MED ORDER — ONDANSETRON 4 MG PO TBDP: 4.0000 mg | ORAL_TABLET | Freq: Three times a day (TID) | ORAL | Status: DC | PRN

## 2015-08-26 MED ORDER — HYDROMORPHONE HCL 1 MG/ML IJ SOLN
1.0000 mg | Freq: Once | INTRAMUSCULAR | Status: AC
  Administered 2015-08-26: 1 mg via INTRAVENOUS
  Filled 2015-08-26: qty 1

## 2015-08-26 NOTE — ED Notes (Signed)
Pt stated that she was not able to provide a urine sample at this time. Will check back.

## 2015-08-26 NOTE — ED Notes (Signed)
Pt c/o abdominal pain onset last night. Pt denies change in diet. Pt reports N/V. Pt denies being around anyone else with illness.

## 2015-08-26 NOTE — Discharge Instructions (Signed)
Take the prescribed medication as directed.  Continue your antacids and watch intake of spicy/acidic/greasy foods. Follow-up with GI -- call to make appt. Return to the ED for new or worsening symptoms.

## 2015-08-26 NOTE — ED Provider Notes (Signed)
CSN: [id number]     Arrival date & time 08/26/15  1023 History   First MD Initiated Contact with Patient 08/26/15 1123     Chief Complaint  Patient presents with  . Abdominal Pain     (Consider location/radiation/quality/duration/timing/severity/associated sxs/prior Treatment) Patient is a 39 y.o. female presenting with abdominal pain. The history is provided by the patient and medical records.  Abdominal Pain Associated symptoms: nausea and vomiting    39 y.o. F with hx of gastric ulcer, GERD, anxiety, IBS, headaches, presenting to the ED for abdominal pain.  Patient reports for the past 24 hours she has had epigastric abdominal pain with associated nausea and vomiting.  She states she has had these episodes in the past, usually occurring every few weeks but will resolve spontaneously. She has never had any evaluation for this. She denies any fever, chills, chest pain, or shortness of breath. No prior abdominal surgeries. States she does have history of gastric ulcers, however states this feels very different. She denies intake of fatty, greasy, fried, or spicy foods recently. She states she is drastically changed her diet over the past year due to her ulcer. She is not currently followed by GI. No prior abdominal surgeries.  VSS.  Past Medical History  Diagnosis Date  . Gastric ulcer   . GERD (gastroesophageal reflux disease)   . Anxiety   . Irritable bowel   . Headache   . Bunion    Past Surgical History  Procedure Laterality Date  . Foot surgery     No family history on file. Social History  Substance Use Topics  . Smoking status: Current Some Day Smoker    Types: Cigarettes  . Smokeless tobacco: None  . Alcohol Use: Yes   OB History    No data available     Review of Systems  Gastrointestinal: Positive for nausea, vomiting and abdominal pain.  All other systems reviewed and are negative.     Allergies  Review of patient's allergies indicates no known  allergies.  Home Medications   Prior to Admission medications   Medication Sig Start Date End Date Taking? Authorizing Provider  aspirin-acetaminophen-caffeine (EXCEDRIN MIGRAINE) [phone] MG tablet Take 2 tablets by mouth every 6 (six) hours as needed for headache.   Yes Historical Provider, MD  ranitidine (ZANTAC) 150 MG tablet Take 150 mg by mouth daily as needed for heartburn.   Yes Historical Provider, MD   BP 112/69 mmHg  Pulse 58  Temp(Src) 99.3 F (37.4 C) (Oral)  Resp 18  Ht  (1.6 m)  Wt 175 lb (79.379 kg)  BMI 31.01 kg/m2  SpO2 100%  LMP 08/01/2015   Physical Exam  Constitutional: She is oriented to person, place, and time. She appears well-developed and well-nourished. No distress.  HENT:  Head: Normocephalic and atraumatic.  Mouth/Throat: Oropharynx is clear and moist.  Eyes: Conjunctivae and EOM are normal. Pupils are equal, round, and reactive to light.  Neck: Normal range of motion.  Cardiovascular: Normal rate, regular rhythm and normal heart sounds.   Pulmonary/Chest: Effort normal and breath sounds normal. No respiratory distress. She has no wheezes.  Abdominal: Soft. Bowel sounds are normal. There is tenderness in the epigastric area. There is no guarding.  TTP in epigastrium without rebound or guarding; no peritonitis  Musculoskeletal: Normal range of motion.  Neurological: She is alert and oriented to person, place, and time.  Skin: Skin is warm. She is not diaphoretic.  Psychiatric: She has a normal mood  and affect.  Nursing note and vitals reviewed.   ED Course  Procedures (including critical care time) Labs Review Labs Reviewed  LIPASE, BLOOD - Abnormal; Notable for the following:    Lipase 20 (*)    All other components within normal limits  COMPREHENSIVE METABOLIC PANEL - Abnormal; Notable for the following:    ALT 12 (*)    Total Bilirubin 1.4 (*)    All other components within normal limits  URINALYSIS, ROUTINE W REFLEX MICROSCOPIC  (NOT [facility]) - Abnormal; Notable for the following:    APPearance CLOUDY (*)    Leukocytes, UA TRACE (*)    All other components within normal limits  CBC  URINE MICROSCOPIC-ADD ON  I-STAT BETA HCG BLOOD, ED ([facility], [facility], [facility] ONLY)    Imaging Review [place] Abdomen Complete  08/26/2015  CLINICAL DATA:  39 year old female with acute right abdominal and epigastric pain with nausea and vomiting. EXAM: ULTRASOUND ABDOMEN COMPLETE COMPARISON:  07/20/2012 CT FINDINGS: Gallbladder: Unremarkable. There is no evidence of cholelithiasis or acute cholecystitis. Common bile duct: Diameter: 4.4 mm. There is no evidence of intrahepatic or extrahepatic biliary dilatation. Liver: No focal lesion identified. Within normal limits in parenchymal echogenicity. IVC: No abnormality visualized. Pancreas: Visualized portion unremarkable. Spleen: Size and appearance within normal limits. Right Kidney: Length: 10.1 cm. A 2.3 cm upper pole cyst is present. Echogenicity within normal limits. No mass or hydronephrosis visualized. Left Kidney: Length: 11.4 cm. Echogenicity within normal limits. No mass or hydronephrosis visualized. Abdominal aorta: No aneurysm visualized. Other findings: None. IMPRESSION: No acute or significant abnormalities. Electronically Signed   By: Harmon PierJeffrey  Hu M.D.   On: 08/26/2015 13:09   I have personally reviewed and evaluated these images and lab results as part of my medical decision-making.   EKG Interpretation None      MDM   Final diagnoses:  Abdominal pain, unspecified abdominal location  Nausea and vomiting, vomiting of unspecified type   39 y.o. F here with epigastric pain x 24 hours with N/V.  Hx of gastric ulcers.  Patient afebrile, non-toxic.  Some tenderness noted in epigastrium without rebound or guarding.  No chest pain or SOB.  Lab work overall reassuring.  U/s was obtained-- no evidence of acute pathology. Patient was treated in emergency department with IV fluids, Zofran, morphine, with  improvement of her symptoms. She has tolerated oral fluids without difficulty. At this time, low suspicion for acute/surgical abdominal pathology.  Symptoms may be related to her gastric ulcer.  Patient will be discharged home with supportive care-- continue zantac, Rx vicodin and zofran.  Referred to GI for follow-up.  Discussed plan with patient, he/she acknowledged understanding and agreed with plan of care.  Return precautions given for new or worsening symptoms.  Garlon HatchetLisa M Ameilia Rattan, PA-C 08/26/15 1801  Gerhard Munchobert Lockwood, MD 08/27/15 [phone]

## 2016-01-20 ENCOUNTER — Encounter (HOSPITAL_COMMUNITY): Payer: Self-pay | Admitting: Emergency Medicine

## 2016-01-20 ENCOUNTER — Emergency Department (HOSPITAL_COMMUNITY)
Admission: EM | Admit: 2016-01-20 | Discharge: 2016-01-20 | Disposition: A | Discharge status: Home / Self Care | Payer: Medicaid Other | Attending: Emergency Medicine | Admitting: Emergency Medicine

## 2016-01-20 ENCOUNTER — Emergency Department (HOSPITAL_COMMUNITY): Payer: Medicaid Other

## 2016-01-20 DIAGNOSIS — K219 Gastro-esophageal reflux disease without esophagitis: Secondary | ICD-10-CM | POA: Insufficient documentation

## 2016-01-20 DIAGNOSIS — Z3202 Encounter for pregnancy test, result negative: Secondary | ICD-10-CM | POA: Insufficient documentation

## 2016-01-20 DIAGNOSIS — R42 Dizziness and giddiness: Secondary | ICD-10-CM | POA: Insufficient documentation

## 2016-01-20 DIAGNOSIS — K279 Peptic ulcer, site unspecified, unspecified as acute or chronic, without hemorrhage or perforation: Secondary | ICD-10-CM

## 2016-01-20 DIAGNOSIS — Z8659 Personal history of other mental and behavioral disorders: Secondary | ICD-10-CM | POA: Insufficient documentation

## 2016-01-20 DIAGNOSIS — Z8739 Personal history of other diseases of the musculoskeletal system and connective tissue: Secondary | ICD-10-CM | POA: Insufficient documentation

## 2016-01-20 DIAGNOSIS — Z7982 Long term (current) use of aspirin: Secondary | ICD-10-CM | POA: Insufficient documentation

## 2016-01-20 DIAGNOSIS — R0602 Shortness of breath: Secondary | ICD-10-CM | POA: Insufficient documentation

## 2016-01-20 DIAGNOSIS — R079 Chest pain, unspecified: Secondary | ICD-10-CM | POA: Insufficient documentation

## 2016-01-20 DIAGNOSIS — F17210 Nicotine dependence, cigarettes, uncomplicated: Secondary | ICD-10-CM | POA: Insufficient documentation

## 2016-01-20 LAB — COMPREHENSIVE METABOLIC PANEL
ALT: 12 U/L — AB (ref 14–54)
ANION GAP: 12 (ref 5–15)
AST: 16 U/L (ref 15–41)
Albumin: 3.8 g/dL (ref 3.5–5.0)
Alkaline Phosphatase: 61 U/L (ref 38–126)
BUN: 7 mg/dL (ref 6–20)
CALCIUM: 9.2 mg/dL (ref 8.9–10.3)
CHLORIDE: 106 mmol/L (ref 101–111)
CO2: 25 mmol/L (ref 22–32)
CREATININE: 0.76 mg/dL (ref 0.44–1.00)
Glucose, Bld: 111 mg/dL — ABNORMAL HIGH (ref 65–99)
Potassium: 3.8 mmol/L (ref 3.5–5.1)
SODIUM: 143 mmol/L (ref 135–145)
Total Bilirubin: 0.6 mg/dL (ref 0.3–1.2)
Total Protein: 6.6 g/dL (ref 6.5–8.1)

## 2016-01-20 LAB — I-STAT BETA HCG BLOOD, ED (MC, WL, AP ONLY)

## 2016-01-20 LAB — CBC
HCT: 37.4 % (ref 36.0–46.0)
Hemoglobin: 13.1 g/dL (ref 12.0–15.0)
MCH: 32 pg (ref 26.0–34.0)
MCHC: 35 g/dL (ref 30.0–36.0)
MCV: 91.4 fL (ref 78.0–100.0)
PLATELETS: 319 10*3/uL (ref 150–400)
RBC: 4.09 MIL/uL (ref 3.87–5.11)
RDW: 12.3 % (ref 11.5–15.5)
WBC: 10.2 10*3/uL (ref 4.0–10.5)

## 2016-01-20 LAB — URINALYSIS, ROUTINE W REFLEX MICROSCOPIC
BILIRUBIN URINE: NEGATIVE
Glucose, UA: NEGATIVE mg/dL
Hgb urine dipstick: NEGATIVE
KETONES UR: NEGATIVE mg/dL
NITRITE: NEGATIVE
Protein, ur: NEGATIVE mg/dL
SPECIFIC GRAVITY, URINE: 1.019 (ref 1.005–1.030)
pH: 5.5 (ref 5.0–8.0)

## 2016-01-20 LAB — I-STAT TROPONIN, ED: Troponin i, poc: 0 ng/mL (ref 0.00–0.08)

## 2016-01-20 LAB — LIPASE, BLOOD: LIPASE: 26 U/L (ref 11–51)

## 2016-01-20 LAB — URINE MICROSCOPIC-ADD ON

## 2016-01-20 MED ORDER — SUCRALFATE 1 GM/10ML PO SUSP: 1.0000 g | Freq: Three times a day (TID) | ORAL | Status: DC

## 2016-01-20 MED ORDER — ONDANSETRON HCL 4 MG/2ML IJ SOLN
4.0000 mg | Freq: Once | INTRAMUSCULAR | Status: AC
  Administered 2016-01-20: 4 mg via INTRAVENOUS
  Filled 2016-01-20: qty 2

## 2016-01-20 MED ORDER — PROMETHAZINE HCL 25 MG PO TABS: 25.0000 mg | ORAL_TABLET | Freq: Four times a day (QID) | ORAL | Status: DC | PRN

## 2016-01-20 MED ORDER — PANTOPRAZOLE SODIUM 40 MG IV SOLR
40.0000 mg | INTRAVENOUS | Status: AC
  Administered 2016-01-20: 40 mg via INTRAVENOUS
  Filled 2016-01-20: qty 40

## 2016-01-20 MED ORDER — GI COCKTAIL ~~LOC~~
30.0000 mL | Freq: Once | ORAL | Status: AC
  Administered 2016-01-20: 30 mL via ORAL
  Filled 2016-01-20: qty 30

## 2016-01-20 MED ORDER — SUCRALFATE 1 G PO TABS
1.0000 g | ORAL_TABLET | Freq: Once | ORAL | Status: AC
  Administered 2016-01-20: 1 g via ORAL
  Filled 2016-01-20: qty 1

## 2016-01-20 NOTE — ED Notes (Signed)
When pt stood up during othostatic v/s pt became very lightheaded but was able to stand up to complete b/p. Pt remained lightheaded while standing no other complaints noted at this time

## 2016-01-20 NOTE — ED Notes (Signed)
Pt recently went to restroom, unable to provide urine at this time. Advised pt we need a sample next time she goes.

## 2016-01-20 NOTE — Discharge Instructions (Signed)
Peptic Ulcer Disease A peptic ulcer is a sore in the lining of your esophagus (esophageal ulcer), stomach (gastric ulcer), or in the first part of your small intestine (duodenal ulcer). The ulcer causes erosion into the deeper tissue. CAUSES  Normally, the lining of the stomach and the small intestine protects itself from the acid that digests food. The protective lining can be damaged by:  An infection caused by a bacterium called Helicobacter pylori (H. pylori).  Regular use of nonsteroidal anti-inflammatory drugs (NSAIDs), such as ibuprofen or aspirin.  Smoking tobacco. Other risk factors include being older than 50, drinking alcohol excessively, and having a family history of ulcer disease.  SYMPTOMS   Burning pain or gnawing in the area between the chest and the belly button.  Heartburn.  Nausea and vomiting.  Bloating. The pain can be worse on an empty stomach and at night. If the ulcer results in bleeding, it can cause:  Black, tarry stools.  Vomiting of bright red blood.  Vomiting of coffee-ground-looking materials. DIAGNOSIS  A diagnosis is usually made based upon your history and an exam. Other tests and procedures may be performed to find the cause of the ulcer. Finding a cause will help determine the best treatment. Tests and procedures may include:  Blood tests, stool tests, or breath tests to check for the bacterium H. pylori.  An upper gastrointestinal (GI) series of the esophagus, stomach, and small intestine.  An endoscopy to examine the esophagus, stomach, and small intestine.  A biopsy. TREATMENT  Treatment may include:  Eliminating the cause of the ulcer, such as smoking, NSAIDs, or alcohol.  Medicines to reduce the amount of acid in your digestive tract.  Antibiotic medicines if the ulcer is caused by the H. pylori bacterium.  An upper endoscopy to treat a bleeding ulcer.  Surgery if the bleeding is severe or if the ulcer created a hole somewhere  in the digestive system. HOME CARE INSTRUCTIONS   Avoid tobacco, alcohol, and caffeine. Smoking can increase the acid in the stomach, and continued smoking will impair the healing of ulcers.  Avoid foods and drinks that seem to cause discomfort or aggravate your ulcer.  Only take medicines as directed by your caregiver. Do not substitute over-the-counter medicines for prescription medicines without talking to your caregiver.  Keep any follow-up appointments and tests as directed. SEEK MEDICAL CARE IF:   Your do not improve within 7 days of starting treatment.  You have ongoing indigestion or heartburn. SEEK IMMEDIATE MEDICAL CARE IF:   You have sudden, sharp, or persistent abdominal pain.  You have bloody or dark black, tarry stools.  You vomit blood or vomit that looks like coffee grounds.  You become light-headed, weak, or feel faint.  You become sweaty or clammy. MAKE SURE YOU:   Understand these instructions.  Will watch your condition.  Will get help right away if you are not doing well or get worse.   This information is not intended to replace advice given to you by your health care provider. Make sure you discuss any questions you have with your health care provider.   Document Released: 10/26/2000 Document Revised: 11/19/2014 Document Reviewed: 05/28/2012 Elsevier Interactive Patient Education [employer]2016 [employer]

## 2016-01-20 NOTE — ED Provider Notes (Signed)
CSN: [id number]     Arrival date & time 01/20/16  1320 History   First MD Initiated Contact with Patient 01/20/16 1820     Chief Complaint  Patient presents with  . Emesis  . Shortness of Breath  . Chest Pain     (Consider location/radiation/quality/duration/timing/severity/associated sxs/prior Treatment) HPI Comments: Patient is a 40 year old female with a history of gastric ulcers, esophageal reflux, irritable bowel disease, and anxiety. She presents to the emergency department for evaluation of emesis which began after eating eggs and toast at brunch at noontime today. She states that she started to feel nauseous shortly after eating. She had a large amount of nonbloody emesis shortly after. After vomiting, patient states that she began to feel short of breath with chest tightness. Tightness was worse in her central chest. She felt lightheaded shortly after without syncope. Patient states that she usually takes Zantac daily, but had not taken it yet today. Patient c/o continued nausea and sporadic NB/NB emesis since symptom onset. SOB has resolved. She has had a hx of emesis, similar to today, secondary to her reflux. No associated fever, abdominal pain, or bowel changes. No hx of abdominal surgeries. No PHx of ACS, DM, CAD, HTN, or HLD. Unknown FHx as patient is adopted.  Patient is a 40 y.o. female presenting with vomiting, shortness of breath, and chest pain. The history is provided by the patient. No language interpreter was used.  Emesis Associated symptoms: no abdominal pain   Shortness of Breath Associated symptoms: chest pain and vomiting   Associated symptoms: no abdominal pain and no fever   Chest Pain Associated symptoms: nausea, shortness of breath and vomiting   Associated symptoms: no abdominal pain, no dizziness and no fever     Past Medical History  Diagnosis Date  . Gastric ulcer   . GERD (gastroesophageal reflux disease)   . Anxiety   . Irritable bowel   . Headache    . Bunion    Past Surgical History  Procedure Laterality Date  . Foot surgery     History reviewed. No pertinent family history. Social History  Substance Use Topics  . Smoking status: Current Some Day Smoker    Types: Cigarettes  . Smokeless tobacco: None  . Alcohol Use: Yes   OB History    No data available      Review of Systems  Constitutional: Negative for fever.  Respiratory: Positive for shortness of breath.   Cardiovascular: Positive for chest pain.  Gastrointestinal: Positive for nausea and vomiting. Negative for abdominal pain.  Neurological: Positive for light-headedness. Negative for dizziness and syncope.  All other systems reviewed and are negative.   Allergies  Review of patient's allergies indicates no known allergies.  Home Medications   Prior to Admission medications   Medication Sig Start Date End Date Taking? Authorizing Provider  aspirin-acetaminophen-caffeine (EXCEDRIN MIGRAINE) [phone] MG tablet Take 2 tablets by mouth every 6 (six) hours as needed for headache.    Historical Provider, MD  HYDROcodone-acetaminophen (NORCO/VICODIN) 5-325 MG tablet Take 1 tablet by mouth every 4 (four) hours as needed. 08/26/15   Garlon Hatchet, PA-C  ondansetron (ZOFRAN ODT) 4 MG disintegrating tablet Take 1 tablet (4 mg total) by mouth every 8 (eight) hours as needed for nausea. 08/26/15   Garlon Hatchet, PA-C  ranitidine (ZANTAC) 150 MG tablet Take 150 mg by mouth daily as needed for heartburn.    Historical Provider, MD   BP 100/39 mmHg  Pulse 69  Temp(Src)  98.8 F (37.1 C) (Oral)  Resp 19  Ht 5\' 3"  (1.6 m)  Wt 81.647 kg  BMI 31.89 kg/m2  SpO2 100%  LMP 12/24/2015 (Approximate)   Physical Exam  Constitutional: She is oriented to person, place, and time. She appears well-developed and well-nourished. No distress.  Nontoxic/nonseptic appearing  HENT:  Head: Normocephalic and atraumatic.  Eyes: Conjunctivae and EOM are normal. No scleral icterus.   Neck: Normal range of motion.  Cardiovascular: Normal rate, regular rhythm and intact distal pulses.   Pulmonary/Chest: Effort normal and breath sounds normal. No respiratory distress. She has no wheezes. She has no rales.  Respirations even and unlabored  Abdominal: Soft. Bowel sounds are normal. She exhibits no distension. There is no tenderness. There is no rebound and no guarding.  Soft, nontender abdomen. No masses or peritoneal signs. Negative Murphy's sign.  Musculoskeletal: Normal range of motion.  Neurological: She is alert and oriented to person, place, and time. She exhibits normal muscle tone. Coordination normal.  Patient moving all extremities.  Skin: Skin is warm and dry. No rash noted. She is not diaphoretic. No erythema. No pallor.  Psychiatric: She has a normal mood and affect. Her behavior is normal.  Nursing note and vitals reviewed.   ED Course  Procedures (including critical care time) Labs Review Labs Reviewed  COMPREHENSIVE METABOLIC PANEL - Abnormal; Notable for the following:    Glucose, Bld 111 (*)    ALT 12 (*)    All other components within normal limits  CBC  LIPASE, BLOOD  URINALYSIS, ROUTINE W REFLEX MICROSCOPIC (NOT [facility])  I-STAT TROPOININ, ED  I-STAT BETA HCG BLOOD, ED ([facility], [facility], [facility] ONLY)    Imaging Review Dg Chest 2 View  01/20/2016  CLINICAL DATA:  Mid chest pain with cough and shortness of breath for a few hours EXAM: CHEST  2 VIEW COMPARISON:  05/12/2014, 01/24/2013 FINDINGS: The heart size and mediastinal contours are within normal limits. Both lungs are clear. The visualized skeletal structures are unremarkable. IMPRESSION: No active cardiopulmonary disease. Electronically Signed   By: Esperanza Heiraymond  Rubner M.D.   On: 01/20/2016 13:56   I have personally reviewed and evaluated these images and lab results as part of my medical decision-making.   EKG Interpretation   Date/Time:  Friday January 20 2016 13:43:59 EST Ventricular Rate:  74 PR  Interval:  172 QRS Duration: 82 QT Interval:  362 QTC Calculation: 401 R Axis:   95 Text Interpretation:  Normal sinus rhythm with sinus arrhythmia Rightward  axis Cannot rule out Anterior infarct , age undetermined Abnormal ECG No  significant change was found Confirmed by Manus GunningANCOUR  MD, STEPHEN [phone]([id number]) on  01/20/2016 8:11:31 PM       Medications  pantoprazole (PROTONIX) injection 40 mg (40 mg Intravenous Given 01/20/16 2114)  gi cocktail (Maalox,Lidocaine,Donnatal) (30 mLs Oral Given 01/20/16 2114)  ondansetron (ZOFRAN) injection 4 mg (4 mg Intravenous Given 01/20/16 2111)  sucralfate (CARAFATE) tablet 1 g (1 g Oral Given 01/20/16 2114)    MDM   Final diagnoses:  PUD (peptic ulcer disease)    40 year old female presents to the emergency department for complaints of nausea and vomiting followed by onset of shortness of breath and chest tightness. No syncope or diaphoresis. Patient has a history of esophageal reflux.   Symptoms atypical for ACS. Heart score maximum of 2 (no personal risk factors, but FHx unknown), c/w low risk of acute coronary event. Troponin negative and EKG is reassuring. Remainder of laboratory work up  is noncontributory. CXR without acute cardiopulmonary abnormalities; doubt dissection. Also doubt PE given lack of tachycardia or hypoxia. PERC negative. Symptoms improved with GI cocktail, protonix, and carafate.   Suspect PUD as cause of symptoms today. Will add carafate to Zantac as patient reports trying Prilosec and Protonix in the past with little improvement. Patient given GI referral. Have also advised PCP follow up. Return precautions discussed and provided. Patient discharged in good condition with no unaddressed concerns.   Filed Vitals:   01/20/16 1329 01/20/16 1556 01/20/16 1943  BP: 115/86 106/84 100/39  Pulse: 73 80 69  Temp: 97.9 F (36.6 C) 98.5 F (36.9 C) 98.8 F (37.1 C)  TempSrc: Oral Oral Oral  Resp: Height:  (1.6 m)     Weight: 81.647 kg    SpO2: 97% 100% 100%     Antony Madura, PA-C 01/20/16 2256  Glynn Octave, MD 01/20/16 [phone]

## 2016-01-20 NOTE — ED Notes (Signed)
Pt from home with c/o sudden onset emesis followed by SOB and CP.  Pt additionally reports becoming weaker and dizzy as time as passed.  NAD, A&O.

## 2016-08-11 ENCOUNTER — Encounter (HOSPITAL_COMMUNITY): Payer: Self-pay | Admitting: Physical Medicine and Rehabilitation

## 2016-08-11 ENCOUNTER — Emergency Department (HOSPITAL_COMMUNITY): Payer: Self-pay

## 2016-08-11 ENCOUNTER — Emergency Department (HOSPITAL_COMMUNITY)
Admission: EM | Admit: 2016-08-11 | Discharge: 2016-08-11 | Disposition: A | Discharge status: Home / Self Care | Payer: Self-pay | Attending: Emergency Medicine | Admitting: Emergency Medicine

## 2016-08-11 DIAGNOSIS — M5442 Lumbago with sciatica, left side: Secondary | ICD-10-CM | POA: Insufficient documentation

## 2016-08-11 DIAGNOSIS — X500XXA Overexertion from strenuous movement or load, initial encounter: Secondary | ICD-10-CM | POA: Insufficient documentation

## 2016-08-11 DIAGNOSIS — R109 Unspecified abdominal pain: Secondary | ICD-10-CM | POA: Insufficient documentation

## 2016-08-11 DIAGNOSIS — Z7982 Long term (current) use of aspirin: Secondary | ICD-10-CM | POA: Insufficient documentation

## 2016-08-11 DIAGNOSIS — M545 Low back pain: Secondary | ICD-10-CM

## 2016-08-11 DIAGNOSIS — R112 Nausea with vomiting, unspecified: Secondary | ICD-10-CM | POA: Insufficient documentation

## 2016-08-11 DIAGNOSIS — R197 Diarrhea, unspecified: Secondary | ICD-10-CM | POA: Insufficient documentation

## 2016-08-11 DIAGNOSIS — Z87891 Personal history of nicotine dependence: Secondary | ICD-10-CM | POA: Insufficient documentation

## 2016-08-11 DIAGNOSIS — Y990 Civilian activity done for income or pay: Secondary | ICD-10-CM | POA: Insufficient documentation

## 2016-08-11 DIAGNOSIS — Y939 Activity, unspecified: Secondary | ICD-10-CM | POA: Insufficient documentation

## 2016-08-11 DIAGNOSIS — Y92512 Supermarket, store or market as the place of occurrence of the external cause: Secondary | ICD-10-CM | POA: Insufficient documentation

## 2016-08-11 LAB — CBC WITH DIFFERENTIAL/PLATELET
Basophils Absolute: 0 10*3/uL (ref 0.0–0.1)
Basophils Relative: 0 %
EOS PCT: 1 %
Eosinophils Absolute: 0.1 10*3/uL (ref 0.0–0.7)
HEMATOCRIT: 39.4 % (ref 36.0–46.0)
HEMOGLOBIN: 13.2 g/dL (ref 12.0–15.0)
LYMPHS ABS: 3.8 10*3/uL (ref 0.7–4.0)
LYMPHS PCT: 42 %
MCH: 31.1 pg (ref 26.0–34.0)
MCHC: 33.5 g/dL (ref 30.0–36.0)
MCV: 92.7 fL (ref 78.0–100.0)
Monocytes Absolute: 0.5 10*3/uL (ref 0.1–1.0)
Monocytes Relative: 5 %
Neutro Abs: 4.7 10*3/uL (ref 1.7–7.7)
Neutrophils Relative %: 52 %
PLATELETS: 305 10*3/uL (ref 150–400)
RBC: 4.25 MIL/uL (ref 3.87–5.11)
RDW: 12.1 % (ref 11.5–15.5)
WBC: 9.2 10*3/uL (ref 4.0–10.5)

## 2016-08-11 LAB — URINALYSIS, ROUTINE W REFLEX MICROSCOPIC
Bilirubin Urine: NEGATIVE
Glucose, UA: NEGATIVE mg/dL
HGB URINE DIPSTICK: NEGATIVE
Ketones, ur: NEGATIVE mg/dL
Leukocytes, UA: NEGATIVE
Nitrite: NEGATIVE
Protein, ur: NEGATIVE mg/dL
SPECIFIC GRAVITY, URINE: 1.017 (ref 1.005–1.030)
pH: 6.5 (ref 5.0–8.0)

## 2016-08-11 LAB — LIPASE, BLOOD: LIPASE: 27 U/L (ref 11–51)

## 2016-08-11 LAB — COMPREHENSIVE METABOLIC PANEL
ALK PHOS: 60 U/L (ref 38–126)
ALT: 9 U/L — AB (ref 14–54)
AST: 13 U/L — ABNORMAL LOW (ref 15–41)
Albumin: 3.6 g/dL (ref 3.5–5.0)
Anion gap: 7 (ref 5–15)
BILIRUBIN TOTAL: 1.1 mg/dL (ref 0.3–1.2)
BUN: 7 mg/dL (ref 6–20)
CALCIUM: 8.6 mg/dL — AB (ref 8.9–10.3)
CO2: 23 mmol/L (ref 22–32)
CREATININE: 0.75 mg/dL (ref 0.44–1.00)
Chloride: 104 mmol/L (ref 101–111)
Glucose, Bld: 86 mg/dL (ref 65–99)
Potassium: 3.7 mmol/L (ref 3.5–5.1)
Sodium: 134 mmol/L — ABNORMAL LOW (ref 135–145)
TOTAL PROTEIN: 6.5 g/dL (ref 6.5–8.1)

## 2016-08-11 LAB — PREGNANCY, URINE: PREG TEST UR: NEGATIVE

## 2016-08-11 MED ORDER — ONDANSETRON 4 MG PO TBDP: 4.0000 mg | ORAL_TABLET | Freq: Three times a day (TID) | ORAL | 0 refills | Status: DC | PRN

## 2016-08-11 MED ORDER — KETOROLAC TROMETHAMINE 15 MG/ML IJ SOLN
30.0000 mg | Freq: Once | INTRAMUSCULAR | Status: AC
  Administered 2016-08-11: 30 mg via INTRAVENOUS
  Filled 2016-08-11: qty 2

## 2016-08-11 MED ORDER — NAPROXEN 500 MG PO TABS: 500.0000 mg | ORAL_TABLET | Freq: Two times a day (BID) | ORAL | 0 refills | Status: DC

## 2016-08-11 MED ORDER — DIPHENHYDRAMINE HCL 25 MG PO CAPS
25.0000 mg | ORAL_CAPSULE | Freq: Once | ORAL | Status: AC
  Administered 2016-08-11: 25 mg via ORAL
  Filled 2016-08-11: qty 1

## 2016-08-11 MED ORDER — LIDOCAINE 5 % EX PTCH
1.0000 | MEDICATED_PATCH | CUTANEOUS | Status: DC
  Administered 2016-08-11: 1 via TRANSDERMAL
  Filled 2016-08-11: qty 1

## 2016-08-11 MED ORDER — CYCLOBENZAPRINE HCL 5 MG PO TABS: 5.0000 mg | ORAL_TABLET | Freq: Three times a day (TID) | ORAL | 0 refills | Status: DC | PRN

## 2016-08-11 MED ORDER — MORPHINE SULFATE (PF) 2 MG/ML IV SOLN
2.0000 mg | Freq: Once | INTRAVENOUS | Status: AC
Start: 2016-08-11 — End: 2016-08-11
  Administered 2016-08-11: 2 mg via INTRAVENOUS
  Filled 2016-08-11: qty 1

## 2016-08-11 MED ORDER — HYDROMORPHONE HCL 1 MG/ML IJ SOLN
1.0000 mg | Freq: Once | INTRAMUSCULAR | Status: AC
  Administered 2016-08-11: 1 mg via INTRAVENOUS
  Filled 2016-08-11: qty 1

## 2016-08-11 MED ORDER — ONDANSETRON 4 MG PO TBDP
8.0000 mg | ORAL_TABLET | Freq: Once | ORAL | Status: AC
  Administered 2016-08-11: 8 mg via ORAL
  Filled 2016-08-11: qty 2

## 2016-08-11 MED ORDER — IOPAMIDOL (ISOVUE-300) INJECTION 61%
INTRAVENOUS | Status: AC
  Administered 2016-08-11: 100 mL
  Filled 2016-08-11: qty 100

## 2016-08-11 NOTE — ED Triage Notes (Signed)
Pt reports L sided flank pain x1 month. Also states abdominal cramping and nausea/vomiting.

## 2016-08-11 NOTE — ED Notes (Signed)
Pt returned from CT °

## 2016-08-11 NOTE — ED Notes (Signed)
Post void residual 100cc.

## 2016-08-11 NOTE — Discharge Instructions (Signed)
Read the information below.  Your labs and imaging were re-assuring. Your pain improved following treatment.  You may have a muscle spasm in your back. I have prescribed naprosyn and flexeril for relief. Flexeril can make you drowsy, do not drive after taking. Apply heat or ice for 20 minute increments. Be sure to perform gentle stretching.  I have prescribed zofran for your nausea. I have provided information regarding diet for diarrhea. You can also take over the counter probiotics.  Use the prescribed medication as directed.  Please discuss all new medications with your pharmacist.   It is important to establish a primary provider. I have provided resources, please call for a follow up appointment.  You may return to the Emergency Department at any time for worsening condition or any new symptoms that concern you.

## 2016-08-11 NOTE — ED Provider Notes (Signed)
[facility]-EMERGENCY DEPT Provider Note   CSN: [id number] Arrival date & time: 08/11/16  1051     History   Chief Complaint Chief Complaint  Patient presents with  . Flank Pain    HPI Rachael Moyer is a 40 y.o. female.  Rachael Moyer is a 40 y.o. female with h/o anxiety, GERD, irritable bowel presents to ED with complaint of left low back pain. Onset approximately 1.5 months ago that has progressively worsened. Initially pain was intermittent and now constant, described as a sharp pain, worse with movement and laying on back. She does not report any trauma, but does state that she works at [employer] and does a lot of heavy lifting. Patient also reports left sided abdominal cramping and N/V/D that started approximately 3 days ago, LMP 07/15/16 and wonders if abdominal cramping is secondary to onset of menstrual cramping. No blood in stool or emesis. Reports yesterday she had the urge to urinate and she "urinated on self" yesterday, no other episodes. During ED patient has been up and walking around and using the restroom, no dysuria or hematuria. No fever, unexpected weight loss, steroid use, recent surgeries, h/o cancer, h/o IVDU, steroid use, no bowel incontinence, numbness or weakness. She denies vaginal discharge, vaginal bleeding, vaginal pain, or pelvic pain. No treatments tried.        Past Medical History:  Diagnosis Date  . Anxiety   . Bunion   . Gastric ulcer   . GERD (gastroesophageal reflux disease)   . Headache   . Irritable bowel     Patient Active Problem List   Diagnosis Date Noted  . Bunion     Past Surgical History:  Procedure Laterality Date  . FOOT SURGERY      OB History    No data available       Home Medications    Prior to Admission medications   Medication Sig Start Date End Date Taking? Authorizing Provider  aspirin-acetaminophen-caffeine (EXCEDRIN MIGRAINE) [phone] MG tablet Take 2 tablets by mouth every 6 (six) hours as needed for  headache.    Historical Provider, MD  cyclobenzaprine (FLEXERIL) 5 MG tablet Take 1 tablet (5 mg total) by mouth 3 (three) times daily as needed for muscle spasms. 08/11/16   Lona Kettle, PA-C  HYDROcodone-acetaminophen (NORCO/VICODIN) 5-325 MG tablet Take 1 tablet by mouth every 4 (four) hours as needed. Patient not taking: Reported on 01/20/2016 08/26/15   Garlon Hatchet, PA-C  naproxen (NAPROSYN) 500 MG tablet Take 1 tablet (500 mg total) by mouth 2 (two) times daily. 08/11/16   Lona Kettle, PA-C  ondansetron (ZOFRAN ODT) 4 MG disintegrating tablet Take 1 tablet (4 mg total) by mouth every 8 (eight) hours as needed for nausea or vomiting. 08/11/16   Lona Kettle, PA-C  promethazine (PHENERGAN) 25 MG tablet Take 1 tablet (25 mg total) by mouth every 6 (six) hours as needed for nausea or vomiting. 01/20/16   Antony Madura, PA-C  ranitidine (ZANTAC) 150 MG tablet Take 150 mg by mouth daily as needed for heartburn.    Historical Provider, MD  sucralfate (CARAFATE) 1 GM/10ML suspension Take 10 mLs (1 g total) by mouth 4 (four) times daily -  with meals and at bedtime. 01/20/16   Antony Madura, PA-C    Family History No family history on file.  Social History Social History  Substance Use Topics  . Smoking status: Former Smoker    Types: Cigarettes  . Smokeless tobacco: Never Used  . Alcohol  use Yes     Allergies   Review of patient's allergies indicates no known allergies.   Review of Systems Review of Systems  Constitutional: Negative for fever.  HENT: Negative for trouble swallowing.   Eyes: Negative for visual disturbance.  Respiratory: Negative for shortness of breath.   Cardiovascular: Negative for chest pain.  Gastrointestinal: Positive for abdominal pain ( left sided), diarrhea ( non-bloody), nausea and vomiting ( x 3, non-bloody). Negative for blood in stool and constipation.  Genitourinary: Positive for frequency. Negative for dysuria, hematuria, pelvic pain,  vaginal bleeding, vaginal discharge and vaginal pain.  Musculoskeletal: Positive for back pain and myalgias.  Skin: Negative for rash.  Neurological: Negative for headaches.     Physical Exam Updated Vital Signs BP 115/66   Pulse 75   Temp 98.2 F (36.8 C) (Oral)   Resp 18   Ht 5\' 3"  (1.6 m)   Wt 81.6 kg   SpO2 100%   BMI 31.89 kg/m   Physical Exam  Constitutional: She appears well-developed and well-nourished. No distress.  HENT:  Head: Normocephalic and atraumatic.  Mouth/Throat: Oropharynx is clear and moist. No oropharyngeal exudate.  Eyes: Conjunctivae and EOM are normal. Pupils are equal, round, and reactive to light. Right eye exhibits no discharge. Left eye exhibits no discharge. No scleral icterus.  Neck: Normal range of motion and phonation normal. Neck supple. No neck rigidity. Normal range of motion present.  Cardiovascular: Normal rate, regular rhythm, normal heart sounds and intact distal pulses.   No murmur heard. Pulmonary/Chest: Effort normal and breath sounds normal. No stridor. No respiratory distress. She has no wheezes. She has no rales.  Abdominal: Soft. Bowel sounds are normal. She exhibits no distension. There is tenderness in the left lower quadrant. There is no rigidity, no rebound, no guarding and no CVA tenderness.  Mild TTP LLQ without guarding, rigidity, or peritoneal signs.   Genitourinary: Rectum normal. Rectal exam shows anal tone normal. Pelvic exam was performed with patient in the knee-chest position.  Genitourinary Comments: No external hemorrhoids or fissures noted. Perianal sensation intact. Rectal tone is normal. No tenderness. No masses palpated.  Musculoskeletal: Normal range of motion.  Mild TTP of lumbar spine. Exquisite tenderness of left lumbar paravertebral muscles. No obvious deformity. No rash, warmth, erythema noted. Intact distal pulses. Patient is ambulatory.   Lymphadenopathy:    She has no cervical adenopathy.  Neurological:  She is alert. She has normal strength. She is not disoriented. No sensory deficit. Coordination and gait normal. GCS eye subscore is 4. GCS verbal subscore is 5. GCS motor subscore is 6.  Gross sensation and strength intact and symmetric in all extremities. 2+ patellar DTR symmetric.   Skin: Skin is warm and dry. She is not diaphoretic.  Psychiatric: She has a normal mood and affect. Her behavior is normal.     ED Treatments / Results  Labs (all labs ordered are listed, but only abnormal results are displayed) Labs Reviewed  COMPREHENSIVE METABOLIC PANEL - Abnormal; Notable for the following:       Result Value   Sodium 134 (*)    Calcium 8.6 (*)    AST 13 (*)    ALT 9 (*)    All other components within normal limits  URINALYSIS, ROUTINE W REFLEX MICROSCOPIC (NOT AT [facility])  CBC WITH DIFFERENTIAL/PLATELET  LIPASE, BLOOD  PREGNANCY, URINE  POC URINE PREG, ED    EKG  EKG Interpretation None       Radiology Ct Abdomen  Pelvis W Contrast  Result Date: 08/11/2016 CLINICAL DATA:  Abdominal pain, left upper quadrant pain, left-sided back pain EXAM: CT ABDOMEN AND PELVIS WITH CONTRAST TECHNIQUE: Multidetector CT imaging of the abdomen and pelvis was performed using the standard protocol following bolus administration of intravenous contrast. CONTRAST:  ISOVUE-300 IOPAMIDOL (ISOVUE-300) INJECTION 61% COMPARISON:  None. FINDINGS: Lower chest: No acute abnormality. Hepatobiliary: No focal liver abnormality is seen. No gallstones, gallbladder wall thickening, or biliary dilatation. Pancreas: Unremarkable. No pancreatic ductal dilatation or surrounding inflammatory changes. Spleen: Normal in size without focal abnormality. Adrenals/Urinary Tract: Adrenal glands are unremarkable. No urolithiasis or obstructive uropathy. 2.6 x 2.3 cm hypodense, fluid attenuating right renal mass most consistent with a cyst. Bladder is unremarkable. Stomach/Bowel: Stomach is within normal limits. Appendix  appears normal. No evidence of bowel wall thickening, distention, or inflammatory changes. Vascular/Lymphatic: No significant vascular findings are present. No enlarged abdominal or pelvic lymph nodes. Reproductive: Uterus and bilateral adnexa are unremarkable. Small amount of fluid in the endometrial cavity. Other: No abdominal wall hernia or abnormality. No abdominopelvic ascites. Musculoskeletal: No acute osseous abnormality. No lytic or sclerotic osseous lesion. IMPRESSION: 1. No acute abdominal or pelvic pathology. Electronically Signed   By: Elige Ko   On: 08/11/2016 17:06    Procedures Procedures (including critical care time)  Medications Ordered in ED Medications  ondansetron (ZOFRAN-ODT) disintegrating tablet 8 mg (8 mg Oral Given 08/11/16 1208)  morphine 2 MG/ML injection 2 mg (2 mg Intravenous Given 08/11/16 1311)  iopamidol (ISOVUE-300) 61 % injection (100 mLs  Contrast Given 08/11/16 1631)  HYDROmorphone (DILAUDID) injection 1 mg (1 mg Intravenous Given 08/11/16 1558)  ketorolac (TORADOL) 15 MG/ML injection 30 mg (30 mg Intravenous Given 08/11/16 1726)  diphenhydrAMINE (BENADRYL) capsule 25 mg (25 mg Oral Given 08/11/16 1726)     Initial Impression / Assessment and Plan / ED Course  I have reviewed the triage vital signs and the nursing notes.  Pertinent labs & imaging results that were available during my care of the patient were reviewed by me and considered in my medical decision making (see chart for details).  Clinical Course  Value Comment By Time   On re-evaluation patient laying on back and reports improvement in pain. Resolution of nausea and no episodes of vomiting. Abdomen is non-tender on exam.  Lona Kettle, PA-C 09/30 1710  CT Abdomen Pelvis W Contrast Reviewed Lona Kettle, PA-C 09/30 1730    Patient presents to ED with complaint of left low back pain and abdominal cramping N/V/D. Patient is afebrile and non-toxic appearing in NAD. VSS. Mild TTP of  LLQ without guarding, rigidity, or peritoneal signs; abdomen soft and +BS.TTP of lumbar spine, specifically left lumbar paravertebral muscles. No focal neurologic deficits on exam. Patient is ambulatory, but states painful. PVR 100cc, no urinary retention. Normal rectal tone. Low suspicion for cauda equina. No fever, weight loss, h/o cancer, IVDU, or long term immunosuppression therapy.  Given sxs and physical exam will check basic labs and CT abd/pelvis to r/o intraabdominal pathology. Pain medication and anti-emetic provided.   Pregnancy test negative. Labs re-assuring. U/A negative for UTI. CT abdomen/pelvis negative for acute intra-abdominal or pelvic pathology; no acute osseous abnormality or lytic lesions. On re-evaluation patient endorses improvement in back pain. Abdomen is non-tender on re-evaluation. No episodes of vomiting. Patient tolerating PO medications. Unclear etiology of abdominal cramping and N/V/D - ?irritable bowel syndrome vs. ?menstrual cycle vs. ?AGE. Rx zofran. Suspect back pain is MSK in nature. Discussed  results and plan with patient. RICE protocol and pain medicine indicated. Rx naprosyn and flexeril. Encouraged rest followed by gentle stretching and resumption of activities as tolerated. Follow up with PCP if symptoms persist, resource provided for establishing PCP. Return precautions given. Pt voiced understanding and is agreeable.   Final Clinical Impressions(s) / ED Diagnoses   Final diagnoses:  Left low back pain, with sciatica presence unspecified  Non-intractable vomiting with nausea, vomiting of unspecified type  Diarrhea, unspecified type    New Prescriptions Discharge Medication List as of 08/11/2016  5:26 PM    START taking these medications   Details  cyclobenzaprine (FLEXERIL) 5 MG tablet Take 1 tablet (5 mg total) by mouth 3 (three) times daily as needed for muscle spasms., Starting Sat 08/11/2016, Print    naproxen (NAPROSYN) 500 MG tablet Take 1 tablet  (500 mg total) by mouth 2 (two) times daily., Starting Sat 08/11/2016, Print         Silver PeakAshley Laurel [place] Oborn, [place] JerseyPA-C 08/11/16 2149    Linwood DibblesJon Knapp, MD 08/12/16 [phone]

## 2017-01-18 ENCOUNTER — Encounter (HOSPITAL_COMMUNITY): Payer: Self-pay | Admitting: Emergency Medicine

## 2017-01-18 ENCOUNTER — Emergency Department (HOSPITAL_COMMUNITY)
Admission: EM | Admit: 2017-01-18 | Discharge: 2017-01-18 | Disposition: A | Discharge status: Home / Self Care | Payer: Medicaid Other | Attending: Emergency Medicine | Admitting: Emergency Medicine

## 2017-01-18 DIAGNOSIS — J029 Acute pharyngitis, unspecified: Secondary | ICD-10-CM

## 2017-01-18 DIAGNOSIS — B9789 Other viral agents as the cause of diseases classified elsewhere: Secondary | ICD-10-CM

## 2017-01-18 DIAGNOSIS — J069 Acute upper respiratory infection, unspecified: Secondary | ICD-10-CM | POA: Insufficient documentation

## 2017-01-18 DIAGNOSIS — Z87891 Personal history of nicotine dependence: Secondary | ICD-10-CM | POA: Insufficient documentation

## 2017-01-18 LAB — RAPID STREP SCREEN (MED CTR MEBANE ONLY): STREPTOCOCCUS, GROUP A SCREEN (DIRECT): NEGATIVE

## 2017-01-18 MED ORDER — DEXAMETHASONE SODIUM PHOSPHATE 10 MG/ML IJ SOLN
10.0000 mg | Freq: Once | INTRAMUSCULAR | Status: AC
  Administered 2017-01-18: 10 mg via INTRAMUSCULAR
  Filled 2017-01-18: qty 1

## 2017-01-18 MED ORDER — GUAIFENESIN-CODEINE 100-10 MG/5ML PO SYRP: 5.0000 mL | ORAL_SOLUTION | Freq: Three times a day (TID) | ORAL | 0 refills | Status: DC | PRN

## 2017-01-18 NOTE — Discharge Instructions (Signed)
Her strep test was negative. Have given used steroids in the ED for your sore throat. Continue taking Tylenol Motrin at home. May use a cough syrup. Use over-the-counter decongestants and cough medicine including Mucinex. Continue drinking plenty of water. Follow up with her primary care doctor or return to the ED in 2-3 days if symptoms do not improve. Return sooner symptoms worsen.

## 2017-01-18 NOTE — ED Notes (Signed)
Pt c/o headache and dizziness after decadron injection. Pt about to ambulate around room with out difficulty. Rachael Kubayler Leaphart, PA notified and at bedside. Pt okay with discharge at this time and will return if symptoms get worse.

## 2017-01-18 NOTE — ED Provider Notes (Signed)
[facility]-EMERGENCY DEPT Provider Note   CSN: [id number] Arrival date & time: 01/18/17  8295  By signing my name below, I, Marnette Burgess Long, attest that this documentation has been prepared under the direction and in the presence of [employer], PA-C. Electronically Signed: Marnette Burgess Long, Scribe. 01/18/2017. 11:12 AM.   History   Chief Complaint Chief Complaint  Patient presents with  . Sore Throat  . flu sx   The history is provided by the patient. No language interpreter was used.   HPI Comments:  Rachael Moyer is a 41 y.o. female with a PMHx of GERD, Gastric Ulcer, and Irritable Bowel, who presents to the Emergency Department complaining of constant, gradually worsening, 8/10 sore throat onset three days ago. She reports the sore throat began spontaneously three days ago when she woke up and has gradually worsened since onset. Pt has associated symptoms of cough with phlegm, vomiting phlegm, chest tenderness secondary to cough, mild right-sided otalgia, and nausea. She tried hot tea, Mucinex 2x a day, ASA, and a nasal spray at home with minimal relief of her symptoms. No sick contact with similar symptoms stated. Pt denies fever, rhinorrhea, diarrhea, and any other complaints at this time. Pt did not receive a seasonal influenza vaccination.    Past Medical History:  Diagnosis Date  . Anxiety   . Bunion   . Gastric ulcer   . GERD (gastroesophageal reflux disease)   . Headache   . Irritable bowel     Patient Active Problem List   Diagnosis Date Noted  . Bunion     Past Surgical History:  Procedure Laterality Date  . FOOT SURGERY      OB History    No data available      Home Medications    Prior to Admission medications   Medication Sig Start Date End Date Taking? Authorizing Provider  aspirin-acetaminophen-caffeine (EXCEDRIN MIGRAINE) [phone] MG tablet Take 2 tablets by mouth every 6 (six) hours as needed for headache.    Historical Provider, MD    cyclobenzaprine (FLEXERIL) 5 MG tablet Take 1 tablet (5 mg total) by mouth 3 (three) times daily as needed for muscle spasms. 08/11/16   Lona Kettle, PA-C  HYDROcodone-acetaminophen (NORCO/VICODIN) 5-325 MG tablet Take 1 tablet by mouth every 4 (four) hours as needed. Patient not taking: Reported on 01/20/2016 08/26/15   Garlon Hatchet, PA-C  naproxen (NAPROSYN) 500 MG tablet Take 1 tablet (500 mg total) by mouth 2 (two) times daily. 08/11/16   Lona Kettle, PA-C  ondansetron (ZOFRAN ODT) 4 MG disintegrating tablet Take 1 tablet (4 mg total) by mouth every 8 (eight) hours as needed for nausea or vomiting. 08/11/16   Lona Kettle, PA-C  promethazine (PHENERGAN) 25 MG tablet Take 1 tablet (25 mg total) by mouth every 6 (six) hours as needed for nausea or vomiting. 01/20/16   Antony Madura, PA-C  ranitidine (ZANTAC) 150 MG tablet Take 150 mg by mouth daily as needed for heartburn.    Historical Provider, MD  sucralfate (CARAFATE) 1 GM/10ML suspension Take 10 mLs (1 g total) by mouth 4 (four) times daily -  with meals and at bedtime. 01/20/16   Antony Madura, PA-C    Family History No family history on file.  Social History Social History  Substance Use Topics  . Smoking status: Former Smoker    Types: Cigarettes  . Smokeless tobacco: Never Used  . Alcohol use Yes     Allergies   Patient  has no known allergies.   Review of Systems Review of Systems  Constitutional: Negative for fever.  HENT: Positive for ear pain and sore throat. Negative for rhinorrhea.   Respiratory: Positive for cough.   Gastrointestinal: Positive for abdominal pain, nausea and vomiting (phlegm). Negative for diarrhea.  All other systems reviewed and are negative.    Physical Exam Updated Vital Signs BP 124/83 (BP Location: Left Arm)   Pulse 88   Temp 98.7 F (37.1 C) (Oral)   Resp 19   LMP 01/02/2017   SpO2 100%   Physical Exam  Constitutional: She is oriented to person, place, and time.  She appears well-developed and well-nourished.  Nontoxic appearing.   HENT:  Head: Normocephalic.  Right Ear: Tympanic membrane, external ear and ear canal normal.  Left Ear: External ear and ear canal normal. A middle ear effusion is present.  Nose: Mucosal edema and rhinorrhea present.  Mouth/Throat: Uvula is midline and mucous membranes are normal. No trismus in the jaw. Posterior oropharyngeal edema and posterior oropharyngeal erythema present. No oropharyngeal exudate or tonsillar abscesses. Tonsils are 2+ on the right. Tonsils are 2+ on the left. No tonsillar exudate.  Eyes: Conjunctivae are normal.  Neck: Normal range of motion.  Cardiovascular: Normal rate, regular rhythm and normal heart sounds.   Pulmonary/Chest: Effort normal and breath sounds normal. No respiratory distress. She has no wheezes. She exhibits tenderness.  Chest wall TTP.   Abdominal: She exhibits no distension. There is no tenderness. There is no rebound and no guarding.  Musculoskeletal: Normal range of motion.  Lymphadenopathy:    She has no cervical adenopathy.  Neurological: She is alert and oriented to person, place, and time.  Skin: Skin is warm and dry.  Psychiatric: She has a normal mood and affect.  Nursing note and vitals reviewed.    ED Treatments / Results  DIAGNOSTIC STUDIES:  Oxygen Saturation is 100% on RA, normal by my interpretation.    COORDINATION OF CARE:  11:11 AM Discussed treatment plan with pt at bedside including cough medication, Tessalon, Decadron, Rapid and Flonase and pt agreed to plan.  Labs (all labs ordered are listed, but only abnormal results are displayed) Labs Reviewed  RAPID STREP SCREEN (NOT AT [facility])  CULTURE, GROUP A STREP [facility])    EKG  EKG Interpretation None       Radiology No results found.  Procedures Procedures (including critical care time)  Medications Ordered in ED Medications  dexamethasone (DECADRON) injection 10 mg (10 mg Intramuscular  Given 01/18/17 1127)     Initial Impression / Assessment and Plan / ED Course  I have reviewed the triage vital signs and the nursing notes.  Pertinent labs & imaging results that were available during my care of the patient were reviewed by me and considered in my medical decision making (see chart for details).     The patient resents to the ED with complaints of sore throat, nasal congestion, cough, chills. Patients symptoms are consistent with URI, likely viral etiology. Afebrile the ED. She is not tachycardic, hypoxic, tachypenic. Lungs are clear to auscultation bilaterally. No indication for chest x-ray at this time. Discussed that antibiotics are not indicated for viral infections. Oropharynx with mild erythema and edema. And concerning for PTA or deep neck infection. Clinical presentation is not consistent with PE or pneumonia. Pt will be discharged with symptomatic treatment.  Decadron given in the ED. We'll discharge the cough medicine. Patient requesting work excuse. Verbalizes understanding and is agreeable  with plan. Pt is hemodynamically stable & in NAD prior to dc.   Final Clinical Impressions(s) / ED Diagnoses   Final diagnoses:  Viral URI with cough  Viral pharyngitis    New Prescriptions New Prescriptions   GUAIFENESIN-CODEINE (ROBITUSSIN AC) 100-10 MG/5ML SYRUP    Take 5 mLs by mouth 3 (three) times daily as needed for cough.   I personally performed the services described in this documentation, which was scribed in my presence. The recorded information has been reviewed and is accurate.     Rise Mu, PA-C 01/18/17 1212    Melene Plan, DO 01/18/17 1536

## 2017-01-18 NOTE — ED Triage Notes (Signed)
Sore throat started Tuesday, gradually has gotten worse-- now has cough, nausea, no fever today

## 2017-01-20 LAB — CULTURE, GROUP A STREP (THRC)

## 2017-03-22 ENCOUNTER — Emergency Department (HOSPITAL_COMMUNITY)
Admission: EM | Admit: 2017-03-22 | Discharge: 2017-03-22 | Disposition: A | Discharge status: Home / Self Care | Payer: Medicaid Other | Attending: Emergency Medicine | Admitting: Emergency Medicine

## 2017-03-22 ENCOUNTER — Encounter (HOSPITAL_COMMUNITY): Payer: Self-pay | Admitting: Emergency Medicine

## 2017-03-22 DIAGNOSIS — M7918 Myalgia, other site: Secondary | ICD-10-CM

## 2017-03-22 DIAGNOSIS — Z7982 Long term (current) use of aspirin: Secondary | ICD-10-CM | POA: Insufficient documentation

## 2017-03-22 DIAGNOSIS — M62838 Other muscle spasm: Secondary | ICD-10-CM | POA: Insufficient documentation

## 2017-03-22 DIAGNOSIS — M542 Cervicalgia: Secondary | ICD-10-CM

## 2017-03-22 DIAGNOSIS — G44209 Tension-type headache, unspecified, not intractable: Secondary | ICD-10-CM | POA: Insufficient documentation

## 2017-03-22 DIAGNOSIS — Z87891 Personal history of nicotine dependence: Secondary | ICD-10-CM | POA: Insufficient documentation

## 2017-03-22 LAB — I-STAT BETA HCG BLOOD, ED (MC, WL, AP ONLY)

## 2017-03-22 MED ORDER — CYCLOBENZAPRINE HCL 10 MG PO TABS: 10.0000 mg | ORAL_TABLET | Freq: Two times a day (BID) | ORAL | 0 refills | Status: DC | PRN

## 2017-03-22 NOTE — ED Provider Notes (Signed)
[facility]PT Provider Note   CSN: [id number] Arrival date & time: 03/22/17  0944     History   Chief Complaint Chief Complaint  Patient presents with  . Neck Pain    HPI Rachael Moyer is a 41 y.o. female with a history as described below.  She presents today with three weeks of gradually worsening, constant posterior neck pain.  Her pain feels sharp, is 8/10, associated with headache across the top of her head and around her left eye.  She has been attempting to treat her pain with heat, ice, Ibuprofen, tylenol, and aleve which have not helped.  She denies any history of trauma, changes in activity or instigating events.  She reports history of undiagnosed migraines associated with visual auras but denies recent history of them.    Patient reports that she has been trying to get pregnant for the past month, has not missed any periods but is due to start her menstrual period in one week.    HPI  Past Medical History:  Diagnosis Date  . Anxiety   . Bunion   . Gastric ulcer   . GERD (gastroesophageal reflux disease)   . Headache   . Irritable bowel     Patient Active Problem List   Diagnosis Date Noted  . Bunion     Past Surgical History:  Procedure Laterality Date  . FOOT SURGERY      OB History    No data available       Home Medications    Prior to Admission medications   Medication Sig Start Date End Date Taking? Authorizing Provider  aspirin-acetaminophen-caffeine (EXCEDRIN MIGRAINE) [phone] MG tablet Take 2 tablets by mouth every 6 (six) hours as needed for headache.    [provider]  cyclobenzaprine (FLEXERIL) 10 MG tablet Take 1 tablet (10 mg total) by mouth 2 (two) times daily as needed for muscle spasms. 03/22/17   Alvira MondaySchlossman, Erin, MD  cyclobenzaprine (FLEXERIL) 5 MG tablet Take 1 tablet (5 mg total) by mouth 3 (three) times daily as needed for muscle spasms. 08/11/16   Deborha PaymentMeyer, Ashley L, PA-C  guaiFENesin-codeine (ROBITUSSIN AC)  100-10 MG/5ML syrup Take 5 mLs by mouth 3 (three) times daily as needed for cough. 01/18/17   Rise MuLeaphart, Kenneth T, PA-C  HYDROcodone-acetaminophen (NORCO/VICODIN) 5-325 MG tablet Take 1 tablet by mouth every 4 (four) hours as needed. Patient not taking: Reported on 01/20/2016 08/26/15   Garlon HatchetSanders, Lisa M, PA-C  naproxen (NAPROSYN) 500 MG tablet Take 1 tablet (500 mg total) by mouth 2 (two) times daily. 08/11/16   Deborha PaymentMeyer, Ashley L, PA-C  ondansetron (ZOFRAN ODT) 4 MG disintegrating tablet Take 1 tablet (4 mg total) by mouth every 8 (eight) hours as needed for nausea or vomiting. 08/11/16   Deborha PaymentMeyer, Ashley L, PA-C  promethazine (PHENERGAN) 25 MG tablet Take 1 tablet (25 mg total) by mouth every 6 (six) hours as needed for nausea or vomiting. 01/20/16   Antony MaduraHumes, Kelly, PA-C  ranitidine (ZANTAC) 150 MG tablet Take 150 mg by mouth daily as needed for heartburn.    [provider]  sucralfate (CARAFATE) 1 GM/10ML suspension Take 10 mLs (1 g total) by mouth 4 (four) times daily -  with meals and at bedtime. 01/20/16   Antony MaduraHumes, Kelly, PA-C    Family History No family history on file.  Social History Social History  Substance Use Topics  . Smoking status: Former Smoker    Types: Cigarettes  . Smokeless tobacco: Never Used  .  Alcohol use Yes     Allergies   Patient has no known allergies.   Review of Systems Review of Systems  Constitutional: Negative for chills, diaphoresis, fatigue and fever.  HENT: Negative for ear pain and sore throat.   Eyes: Positive for pain (Pain around left eye, not eye its self) and visual disturbance (Occasionally with headaches, not currently.;). Negative for photophobia, discharge, redness and itching.  Respiratory: Negative for cough, chest tightness and shortness of breath.   Cardiovascular: Negative for chest pain, palpitations and leg swelling.  Gastrointestinal: Positive for diarrhea (For the past three days, normal, consistent with her history of IBS). Negative  for abdominal distention, abdominal pain, constipation, nausea and vomiting.  Genitourinary: Negative for dysuria and hematuria.  Musculoskeletal: Positive for back pain, myalgias and neck pain. Negative for arthralgias and joint swelling.  Skin: Negative for color change and rash.  Neurological: Positive for headaches. Negative for seizures and syncope.  Psychiatric/Behavioral: Negative for agitation and sleep disturbance.  All other systems reviewed and are negative.    Physical Exam Updated Vital Signs BP 133/87 (BP Location: Right Arm)   Pulse 78   Temp 98.5 F (36.9 C) (Oral)   Resp 20   SpO2 100%   Physical Exam  Constitutional: She is oriented to person, place, and time. She appears well-developed and well-nourished. No distress.  HENT:  Head: Normocephalic and atraumatic.  Right Ear: External ear normal.  Left Ear: External ear normal.  Nose: Nose normal.  Mouth/Throat: Oropharynx is clear and moist. No oropharyngeal exudate.  Eyes: Conjunctivae and EOM are normal. Pupils are equal, round, and reactive to light. Right eye exhibits no discharge. Left eye exhibits no discharge. No scleral icterus.  Neck: Neck supple. No JVD present. No tracheal deviation present.  Cardiovascular: Normal rate, regular rhythm and intact distal pulses.   Pulmonary/Chest: Effort normal. No stridor. No respiratory distress. She has no wheezes.  Abdominal: Soft. She exhibits no distension and no mass. There is no tenderness. There is no guarding.  Musculoskeletal:       Cervical back: She exhibits decreased range of motion (Secondary to pain), tenderness (To bilateral paraspinal muscles), pain and spasm. She exhibits no bony tenderness, no swelling, no deformity and no laceration.       Thoracic back: She exhibits tenderness (Left paraspinal muscle), pain and spasm. She exhibits no bony tenderness, no swelling, no edema, no deformity and no laceration.       Lumbar back: Normal.  Patients pain is  exacerbated and re-created in character and location with palpation of cervical paraspinal muscles and sub occipital palpation of para-spinal muscles.  Cervical paraspinal muscles are in obvious spasm.   Lymphadenopathy:    She has no cervical adenopathy.  Neurological: She is alert and oriented to person, place, and time. She has normal strength. She displays no atrophy and no tremor. No cranial nerve deficit or sensory deficit. She exhibits normal muscle tone. Coordination and gait normal.  Neurologically intact  Skin: Skin is warm and dry. No rash noted. She is not diaphoretic. No erythema. No pallor.  Psychiatric: She has a normal mood and affect. Her behavior is normal.  Nursing note and vitals reviewed.    ED Treatments / Results  Labs (all labs ordered are listed, but only abnormal results are displayed) Labs Reviewed  I-STAT BETA HCG BLOOD, ED ([facility], [facility], [facility] ONLY)    EKG  EKG Interpretation None       Radiology No results found.  Procedures  Procedures (including critical care time)  Medications Ordered in ED Medications - No data to display   Initial Impression / Assessment and Plan / ED Course  I have reviewed the triage vital signs and the nursing notes.  Pertinent labs & imaging results that were available during my care of the patient were reviewed by me and considered in my medical decision making (see chart for details).     Patient with back/posterior neck pain and headache.  No neurological deficits and normal neuro exam.  Patient can walk but states is painful.  No loss of bowel or bladder control.  No concern for cauda equina.  No fever, night sweats, weight loss, h/o cancer, IVDU.  RICE protocol and pain medicine indicated and discussed with patient. Pregnancy test performed and was negative.  Patient will be given RX for flexeril.   Patient will be given rx for flexeril.  Patient was advised that this medication may make them sleepy and impaired.  Patient  was instructed not to drive, operate heavy machinery, etc for 24 hours after taking the medication or longer if they feel tired or impaired.    Final Clinical Impressions(s) / ED Diagnoses   Final diagnoses:  Neck pain  Muscle spasms of neck  Musculoskeletal pain  Acute non intractable tension-type headache    New Prescriptions New Prescriptions   CYCLOBENZAPRINE (FLEXERIL) 10 MG TABLET    Take 1 tablet (10 mg total) by mouth 2 (two) times daily as needed for muscle spasms.     Cristina Gong, PA-C 03/22/17 1108    Alvira Monday, MD 03/22/17 2249

## 2017-03-22 NOTE — ED Triage Notes (Signed)
Pt reports pain to posterior, right and left areas of neck x3 weeks with headaches now occurring, denies fevers or being around anyone else that is sick. Pt a/ox4, resp e/u,nad.

## 2017-03-22 NOTE — Discharge Instructions (Signed)
You are not pregnant according to the test today, but it may be too early for your test to be positive.  Please take ibuprofen and tylenol as needed for pain.  You can take up to 600mg  Ibuprofen three times a day and may take up to 3,000 mg of tylenol a day in-between ibuprofen doses.  Please read the inclosed instructions about muscle pain.

## 2017-03-26 ENCOUNTER — Ambulatory Visit (INDEPENDENT_AMBULATORY_CARE_PROVIDER_SITE_OTHER): Payer: Medicaid Other | Admitting: Physician Assistant

## 2017-04-12 ENCOUNTER — Encounter (INDEPENDENT_AMBULATORY_CARE_PROVIDER_SITE_OTHER): Payer: Self-pay | Admitting: Physician Assistant

## 2017-04-12 ENCOUNTER — Ambulatory Visit (INDEPENDENT_AMBULATORY_CARE_PROVIDER_SITE_OTHER): Payer: Self-pay | Admitting: Physician Assistant

## 2017-04-12 VITALS — BP 115/80 | HR 82 | Temp 98.1°F | Ht 64.0 in | Wt 188.8 lb

## 2017-04-12 DIAGNOSIS — K219 Gastro-esophageal reflux disease without esophagitis: Secondary | ICD-10-CM

## 2017-04-12 DIAGNOSIS — Z3200 Encounter for pregnancy test, result unknown: Secondary | ICD-10-CM

## 2017-04-12 DIAGNOSIS — R431 Parosmia: Secondary | ICD-10-CM

## 2017-04-12 DIAGNOSIS — M542 Cervicalgia: Secondary | ICD-10-CM

## 2017-04-12 LAB — POCT URINE PREGNANCY: PREG TEST UR: NEGATIVE

## 2017-04-12 LAB — POCT URINALYSIS DIPSTICK
Bilirubin, UA: NEGATIVE
Glucose, UA: NEGATIVE
Ketones, UA: NEGATIVE
Leukocytes, UA: NEGATIVE
NITRITE UA: NEGATIVE
PH UA: 6 (ref 5.0–8.0)
Protein, UA: NEGATIVE
RBC UA: NEGATIVE
Spec Grav, UA: 1.02 (ref 1.010–1.025)
UROBILINOGEN UA: 1 U/dL

## 2017-04-12 MED ORDER — METHOCARBAMOL 500 MG PO TABS: 500.0000 mg | ORAL_TABLET | Freq: Four times a day (QID) | ORAL | 0 refills | Status: DC

## 2017-04-12 MED ORDER — MELOXICAM 15 MG PO TABS: 15.0000 mg | ORAL_TABLET | Freq: Every day | ORAL | 0 refills | Status: DC

## 2017-04-12 MED ORDER — OMEPRAZOLE 40 MG PO CPDR: 40.0000 mg | DELAYED_RELEASE_CAPSULE | Freq: Every day | ORAL | 3 refills | Status: DC

## 2017-04-12 NOTE — Patient Instructions (Addendum)
Food Choices for Gastroesophageal Reflux Disease, Adult When you have gastroesophageal reflux disease (GERD), the foods you eat and your eating habits are very important. Choosing the right foods can help ease your discomfort. What guidelines do I need to follow?  Choose fruits, vegetables, whole grains, and low-fat dairy products.  Choose low-fat meat, fish, and poultry.  Limit fats such as oils, salad dressings, butter, nuts, and avocado.  Keep a food diary. This helps you identify foods that cause symptoms.  Avoid foods that cause symptoms. These may be different for everyone.  Eat small meals often instead of 3 large meals a day.  Eat your meals slowly, in a place where you are relaxed.  Limit fried foods.  Cook foods using methods other than frying.  Avoid drinking alcohol.  Avoid drinking large amounts of liquids with your meals.  Avoid bending over or lying down until 2-3 hours after eating. What foods are not recommended? These are some foods and drinks that may make your symptoms worse: Vegetables Tomatoes. Tomato juice. Tomato and spaghetti sauce. Chili peppers. Onion and garlic. Horseradish. Fruits Oranges, grapefruit, and lemon (fruit and juice). Meats High-fat meats, fish, and poultry. This includes hot dogs, ribs, ham, sausage, salami, and bacon. Dairy Whole milk and chocolate milk. Sour cream. Cream. Butter. Ice cream. Cream cheese. Drinks Coffee and tea. Bubbly (carbonated) drinks or energy drinks. Condiments Hot sauce. Barbecue sauce. Sweets/Desserts Chocolate and cocoa. Donuts. Peppermint and spearmint. Fats and Oils High-fat foods. This includes [place] fries and potato chips. Other Vinegar. Strong spices. This includes black pepper, white pepper, red pepper, cayenne, curry powder, cloves, ginger, and chili powder. The items listed above may not be a complete list of foods and drinks to avoid. Contact your dietitian for more information. This  information is not intended to replace advice given to you by your health care provider. Make sure you discuss any questions you have with your health care provider. Document Released: 04/29/2012 Document Revised: 04/05/2016 Document Reviewed: 09/02/2013 Elsevier Interactive Patient Education  2017 [employer] Musculoskeletal Pain Musculoskeletal pain is muscle and bone aches and pains. This pain can occur in any part of the body. Follow these instructions at home:  Only take medicines for pain, discomfort, or fever as told by your health care provider.  You may continue all activities unless the activities cause more pain. When the pain lessens, slowly resume normal activities. Gradually increase the intensity and duration of the activities or exercise.  During periods of severe pain, bed rest may be helpful. Lie or sit in any position that is comfortable, but get out of bed and walk around at least every several hours.  If directed, put ice on the injured area. ? Put ice in a plastic bag. ? Place a towel between your skin and the bag. ? Leave the ice on for 20 minutes, 2-3 times a day. Contact a health care provider if:  Your pain is getting worse.  Your pain is not relieved with medicines.  You lose function in the area of the pain if the pain is in your arms, legs, or neck. This information is not intended to replace advice given to you by your health care provider. Make sure you discuss any questions you have with your health care provider. Document Released: 10/29/2005 Document Revised: 04/10/2016 Document Reviewed: 07/03/2013 Elsevier Interactive Patient Education  2017 [employer]

## 2017-04-12 NOTE — Progress Notes (Signed)
Subjective:  Patient ID: Rachael Moyer, female    DOB: [birth year]  Age: 41 y.o. MRN: [id number]  CC: neck pain  HPI Rachael Moyer is a 41 y.o. female with a PMH of anxiety, GERD, gastric ulcer, and IBS presents as a new pt for cervicalgia.  Cervicalgia x1 month. Not attributed to trauma, job duties, or sleeping position. Pain located midline on the C spine at approximately C4-C5 and in the paraspinals R > L. Has assoicated pain in the occiput and sometimes in the bitemporal region. Cyclobenzaprine from recent ED visit was not helpful.  Denies RUE or LUE paresthesias, weakness, or paralysis.     She also complains of a strong maple syrup smell eminating from her hand. Smell comes and goes. Husband smells it to. Patient does not remember handling any chemicals or maple syrup. She generally feels well and does not endorse cognitive deficits, weight issues, yeast infection, seizures, or other symptoms associated with acidemia.         ROS Review of Systems  Constitutional: Negative for chills, fever and malaise/fatigue.  Eyes: Negative for blurred vision.  Respiratory: Negative for shortness of breath.   Cardiovascular: Negative for chest pain and palpitations.  Gastrointestinal: Positive for heartburn. Negative for abdominal pain and nausea.  Genitourinary: Negative for dysuria and hematuria.  Musculoskeletal: Positive for neck pain. Negative for joint pain and myalgias.  Skin: Negative for rash.  Neurological: Negative for tingling and headaches.  Psychiatric/Behavioral: Negative for depression. The patient is not nervous/anxious.     Objective:  BP 115/80   Pulse 82   Temp 98.1 F (36.7 C) (Oral)   Ht 5\' 4"  (1.626 m)   Wt 188 lb 12.8 oz (85.6 kg)   LMP 03/27/2017 (Exact Date)   SpO2 100%   BMI 32.41 kg/m   BP/Weight 04/12/2017 03/22/2017 01/18/2017  Systolic BP 115 118 134  Diastolic BP 80 93 92  Wt. (Lbs) 188.8 - -  BMI 32.41 - -      Physical Exam  Constitutional:  She is oriented to person, place, and time.  Well developed, overweight, NAD, polite  HENT:  Head: Normocephalic and atraumatic.  Eyes: No scleral icterus.  Neck: Normal range of motion. Neck supple. No thyromegaly present.  Cardiovascular: Normal rate, regular rhythm and normal heart sounds.   Pulmonary/Chest: Effort normal and breath sounds normal.  Musculoskeletal: She exhibits no edema.  Neck with mildly limited rotational aROM bilaterally. No maple syrup smell of the right hand.  Neurological: She is alert and oriented to person, place, and time. No cranial nerve deficit. Coordination normal.  Axial load testing negative bilaterally.  Skin: Skin is warm and dry. No rash noted. No erythema. No pallor.  Psychiatric: She has a normal mood and affect. Her behavior is normal. Thought content normal.  Vitals reviewed.    Assessment & Plan:   1. Cervicalgia - DG Cervical Spine Complete; Future - Ambulatory referral to Physical Therapy - Begin Meloxicam 15 mg daily - Begin Robaxin 500mg  qid.  2. Gastroesophageal reflux disease, esophagitis presence not specified - Begin Omeprazole 40 mg daily.   3. Encounter for pregnancy test, result unknown - POCT urine pregnancy negative in clinic today.  4. Abnormal smell - Maple syrup smell to the right hand x1.5 weeks. - Urinalysis Dipstick negative for Ketones today. Etiology unknown at this point but we will consider work up for [facility]. Will also forward this case to supervisor for advise.    Meds ordered this encounter  Medications  .  meloxicam (MOBIC) 15 MG tablet    Sig: Take 1 tablet (15 mg total) by mouth daily.    Dispense:  30 tablet    Refill:  0    Order Specific Question:   Supervising Provider    Answer:   Quentin AngstJEGEDE, OLUGBEMIGA E [id number]]  . omeprazole (PRILOSEC) 40 MG capsule    Sig: Take 1 capsule (40 mg total) by mouth daily.    Dispense:  30 capsule    Refill:  3    Order Specific Question:   Supervising Provider     Answer:   Quentin AngstJEGEDE, OLUGBEMIGA E [id number]  . methocarbamol (ROBAXIN) 500 MG tablet    Sig: Take 1 tablet (500 mg total) by mouth 4 (four) times daily.    Dispense:  120 tablet    Refill:  0    Order Specific Question:   Supervising Provider    Answer:   Quentin AngstJEGEDE, OLUGBEMIGA E [[id number]]    Follow-up: four weeks   Loletta Specteroger David Jerris Fleer PA

## 2017-04-22 ENCOUNTER — Ambulatory Visit: Payer: Medicaid Other

## 2017-04-24 ENCOUNTER — Ambulatory Visit: Payer: Medicaid Other | Attending: Physician Assistant | Admitting: Physical Therapy

## 2017-05-16 ENCOUNTER — Encounter (INDEPENDENT_AMBULATORY_CARE_PROVIDER_SITE_OTHER): Payer: Self-pay | Admitting: Physician Assistant

## 2017-05-16 ENCOUNTER — Ambulatory Visit (INDEPENDENT_AMBULATORY_CARE_PROVIDER_SITE_OTHER): Payer: Self-pay | Admitting: Physician Assistant

## 2017-05-16 VITALS — BP 121/81 | HR 70 | Temp 98.2°F | Wt 195.2 lb

## 2017-05-16 DIAGNOSIS — M542 Cervicalgia: Secondary | ICD-10-CM

## 2017-05-16 DIAGNOSIS — F411 Generalized anxiety disorder: Secondary | ICD-10-CM

## 2017-05-16 MED ORDER — CLONAZEPAM 0.5 MG PO TABS: 0.5000 mg | ORAL_TABLET | Freq: Every day | ORAL | 0 refills | Status: DC

## 2017-05-16 MED ORDER — SERTRALINE HCL 50 MG PO TABS: 50.0000 mg | ORAL_TABLET | Freq: Every day | ORAL | 3 refills | Status: DC

## 2017-05-16 NOTE — Progress Notes (Signed)
Subjective:  Patient ID: Rachael Moyer, female    DOB: [birth year]  Age: 41 y.o. MRN: [id number]  CC: f/u cervicalgia  HPI Rachael Moyer is a 41 y.o. female with a PMH of anxiety, GERD, gastric ulcer, HA, bunion, and IBS presents to f/u on cervicalgia presents for f/u of cervicalgia. Cervicalgia x2 month. Not attributed to trauma, job duties, or sleeping position. Pain located midline on the C spine at approximately C4-C5 and in the paraspinals R > L. Has associated pain in the occiput and sometimes in the bitemporal region. Cyclobenzaprine from recent ED visit was not helpful. Denies RUE or LUE paresthesias, weakness, or paralysis.    Outpatient Medications Prior to Visit  Medication Sig Dispense Refill  . aspirin-acetaminophen-caffeine (EXCEDRIN MIGRAINE) 250-250-65 MG tablet Take 2 tablets by mouth every 6 (six) hours as needed for headache.    . meloxicam (MOBIC) 15 MG tablet Take 1 tablet (15 mg total) by mouth daily. 30 tablet 0  . omeprazole (PRILOSEC) 40 MG capsule Take 1 capsule (40 mg total) by mouth daily. 30 capsule 3  . methocarbamol (ROBAXIN) 500 MG tablet Take 1 tablet (500 mg total) by mouth 4 (four) times daily. (Patient not taking: Reported on 05/16/2017) 120 tablet 0  . ranitidine (ZANTAC) 150 MG tablet Take 150 mg by mouth daily as needed for heartburn.     No facility-administered medications prior to visit.      ROS Review of Systems  Constitutional: Negative for chills, fever and malaise/fatigue.  Eyes: Negative for blurred vision.  Respiratory: Negative for shortness of breath.   Cardiovascular: Negative for chest pain and palpitations.  Gastrointestinal: Negative for abdominal pain and nausea.  Genitourinary: Negative for dysuria and hematuria.  Musculoskeletal: Negative for joint pain and myalgias.  Skin: Negative for rash.  Neurological: Positive for headaches. Negative for tingling.  Psychiatric/Behavioral: Negative for depression. The patient is  nervous/anxious.     Objective:  BP 121/81 (BP Location: Right Arm, Patient Position: Sitting, Cuff Size: Large)   Pulse 70   Temp 98.2 F (36.8 C) (Oral)   Wt 195 lb 3.2 oz (88.5 kg)   LMP 05/14/2017 (Exact Date)   SpO2 98%   BMI 33.51 kg/m   BP/Weight 05/16/2017 04/12/2017 03/22/2017  Systolic BP 121 115 118  Diastolic BP 81 80 93  Wt. (Lbs) 195.2 188.8 -  BMI 33.51 32.41 -      Physical Exam  Constitutional: She is oriented to person, place, and time.  Well developed, overweight, NAD, polite  HENT:  Head: Normocephalic and atraumatic.  Eyes: No scleral icterus.  Neck: Normal range of motion. Neck supple. No thyromegaly present.  Cardiovascular: Normal rate, regular rhythm and normal heart sounds.   Pulmonary/Chest: Effort normal and breath sounds normal.  Musculoskeletal: She exhibits no edema.  Neurological: She is alert and oriented to person, place, and time. No cranial nerve deficit. Coordination normal.  Skin: Skin is warm and dry. No rash noted. No erythema. No pallor.  Psychiatric: She has a normal mood and affect. Her behavior is normal. Thought content normal.  Vitals reviewed.    Assessment & Plan:   1. Cervicalgia - Pt advised to get her C spine xray done.  2. Generalized anxiety disorder - Begin sertraline (ZOLOFT) 50 MG tablet; Take 1 tablet (50 mg total) by mouth daily.  Dispense: 30 tablet; Refill: 3 - Begin clonazePAM (KLONOPIN) 0.5 MG tablet; Take 1 tablet (0.5 mg total) by mouth at bedtime.  Dispense: 30 tablet; Refill: 0  Meds ordered this encounter  Medications  . sertraline (ZOLOFT) 50 MG tablet    Sig: Take 1 tablet (50 mg total) by mouth daily.    Dispense:  30 tablet    Refill:  3    Order Specific Question:   Supervising Provider    Answer:   Quentin AngstJEGEDE, OLUGBEMIGA E [id number]]  . clonazePAM (KLONOPIN) 0.5 MG tablet    Sig: Take 1 tablet (0.5 mg total) by mouth at bedtime.    Dispense:  30 tablet    Refill:  0    Order Specific  Question:   Supervising Provider    Answer:   Quentin AngstJEGEDE, OLUGBEMIGA E [id number]]    Follow-up: Return in about 4 weeks (around 06/13/2017) for anxiety and cervicalgia.   Loletta Specteroger David Gomez PA

## 2017-05-16 NOTE — Patient Instructions (Signed)
Cervical Radiculopathy Cervical radiculopathy happens when a nerve in the neck (cervical nerve) is pinched or bruised. This condition can develop because of an injury or as part of the normal aging process. Pressure on the cervical nerves can cause pain or numbness that runs from the neck all the way down into the arm and fingers. Usually, this condition gets better with rest. Treatment may be needed if the condition does not improve. What are the causes? This condition may be caused by:  Injury.  Slipped (herniated) disk.  Muscle tightness in the neck because of overuse.  Arthritis.  Breakdown or degeneration in the bones and joints of the spine (spondylosis) due to aging.  Bone spurs that may develop near the cervical nerves.  What are the signs or symptoms? Symptoms of this condition include:  Pain that runs from the neck to the arm and hand. The pain can be severe or irritating. It may be worse when the neck is moved.  Numbness or weakness in the affected arm and hand.  How is this diagnosed? This condition may be diagnosed based on symptoms, medical history, and a physical exam. You may also have tests, including:  X-rays.  CT scan.  MRI.  Electromyogram (EMG).  Nerve conduction tests.  How is this treated? In many cases, treatment is not needed for this condition. With rest, the condition usually gets better over time. If treatment is needed, options may include:  Wearing a soft neck collar for short periods of time.  Physical therapy to strengthen your neck muscles.  Medicines, such as NSAIDs, oral corticosteroids, or spinal injections.  Surgery. This may be needed if other treatments do not help. Various types of surgery may be done depending on the cause of your problems.  Follow these instructions at home: Managing pain  Take over-the-counter and prescription medicines only as told by your health care provider.  If directed, apply ice to the affected  area. ? Put ice in a plastic bag. ? Place a towel between your skin and the bag. ? Leave the ice on for 20 minutes, 2-3 times per day.  If ice does not help, you can try using heat. Take a warm shower or warm bath, or use a heat pack as told by your health care provider.  Try a gentle neck and shoulder massage to help relieve symptoms. Activity  Rest as needed. Follow instructions from your health care provider about any restrictions on activities.  Do stretching and strengthening exercises as told by your health care provider or physical therapist. General instructions  If you were given a soft collar, wear it as told by your health care provider.  Use a flat pillow when you sleep.  Keep all follow-up visits as told by your health care provider. This is important. Contact a health care provider if:  Your condition does not improve with treatment. Get help right away if:  Your pain gets much worse and cannot be controlled with medicines.  You have weakness or numbness in your hand, arm, face, or leg.  You have a high fever.  You have a stiff, rigid neck.  You lose control of your bowels or your bladder (have incontinence).  You have trouble with walking, balance, or speaking. This information is not intended to replace advice given to you by your health care provider. Make sure you discuss any questions you have with your health care provider. Document Released: 07/24/2001 Document Revised: 04/05/2016 Document Reviewed: 12/23/2014 Elsevier Interactive Patient Education    2018 Elsevier Inc.  

## 2017-05-17 ENCOUNTER — Ambulatory Visit (HOSPITAL_COMMUNITY)
Admission: RE | Admit: 2017-05-17 | Discharge: 2017-05-17 | Disposition: A | Discharge status: Home / Self Care | Payer: Medicaid Other | Source: Ambulatory Visit | Attending: Physician Assistant | Admitting: Physician Assistant

## 2017-05-17 DIAGNOSIS — M542 Cervicalgia: Secondary | ICD-10-CM | POA: Insufficient documentation

## 2017-05-22 ENCOUNTER — Telehealth (INDEPENDENT_AMBULATORY_CARE_PROVIDER_SITE_OTHER): Payer: Self-pay

## 2017-05-22 NOTE — Telephone Encounter (Signed)
Patient informed of lab results. [name], CMA

## 2017-05-22 NOTE — Telephone Encounter (Signed)
-----   Message from Loletta Specteroger David Gomez, PA-C sent at 05/22/2017  8:42 AM EDT ----- No fracture or sign of soft tissue swelling. No other acute bone abnormality.

## 2017-06-13 ENCOUNTER — Telehealth (INDEPENDENT_AMBULATORY_CARE_PROVIDER_SITE_OTHER): Payer: Self-pay | Admitting: Physician Assistant

## 2017-06-13 ENCOUNTER — Ambulatory Visit (INDEPENDENT_AMBULATORY_CARE_PROVIDER_SITE_OTHER): Payer: Medicaid Other | Admitting: Physician Assistant

## 2017-06-13 NOTE — Telephone Encounter (Signed)
Controlled substance refill needs appointment here.

## 2017-06-13 NOTE — Telephone Encounter (Signed)
Pt called to request a refill for  clonazePAM (KLONOPIN) 0.5 MG tablet  If you auth the refill, [facility] [address]  Please follow up

## 2017-06-14 NOTE — Telephone Encounter (Signed)
Spoke with patient she has appt scheduled in a few weeks. [name], CMA

## 2017-06-26 ENCOUNTER — Ambulatory Visit (INDEPENDENT_AMBULATORY_CARE_PROVIDER_SITE_OTHER): Payer: Medicaid Other | Admitting: Physician Assistant

## 2017-08-05 ENCOUNTER — Ambulatory Visit (INDEPENDENT_AMBULATORY_CARE_PROVIDER_SITE_OTHER): Payer: Self-pay | Admitting: Physician Assistant

## 2017-08-14 ENCOUNTER — Encounter: Payer: Self-pay | Admitting: Internal Medicine

## 2017-08-14 ENCOUNTER — Ambulatory Visit: Payer: Self-pay | Attending: Internal Medicine | Admitting: Internal Medicine

## 2017-08-14 ENCOUNTER — Other Ambulatory Visit (HOSPITAL_COMMUNITY)
Admission: RE | Admit: 2017-08-14 | Discharge: 2017-08-14 | Disposition: A | Discharge status: Home / Self Care | Payer: Medicaid Other | Source: Ambulatory Visit | Attending: Internal Medicine | Admitting: Internal Medicine

## 2017-08-14 ENCOUNTER — Ambulatory Visit (INDEPENDENT_AMBULATORY_CARE_PROVIDER_SITE_OTHER): Payer: Self-pay | Admitting: Physician Assistant

## 2017-08-14 VITALS — BP 112/79 | HR 81 | Temp 98.3°F | Resp 18 | Ht 63.0 in | Wt 198.0 lb

## 2017-08-14 DIAGNOSIS — R109 Unspecified abdominal pain: Secondary | ICD-10-CM | POA: Insufficient documentation

## 2017-08-14 DIAGNOSIS — K589 Irritable bowel syndrome without diarrhea: Secondary | ICD-10-CM | POA: Insufficient documentation

## 2017-08-14 DIAGNOSIS — F411 Generalized anxiety disorder: Secondary | ICD-10-CM | POA: Insufficient documentation

## 2017-08-14 DIAGNOSIS — K219 Gastro-esophageal reflux disease without esophagitis: Secondary | ICD-10-CM | POA: Insufficient documentation

## 2017-08-14 DIAGNOSIS — Z124 Encounter for screening for malignant neoplasm of cervix: Secondary | ICD-10-CM | POA: Insufficient documentation

## 2017-08-14 LAB — POCT URINALYSIS DIPSTICK
BILIRUBIN UA: NEGATIVE
GLUCOSE UA: NEGATIVE
Ketones, UA: NEGATIVE
NITRITE UA: NEGATIVE
Spec Grav, UA: 1.02 (ref 1.010–1.025)
UROBILINOGEN UA: 1 U/dL
pH, UA: 5.5 (ref 5.0–8.0)

## 2017-08-14 MED ORDER — CLONAZEPAM 0.5 MG PO TABS: 0.5000 mg | ORAL_TABLET | Freq: Every day | ORAL | 0 refills | Status: DC

## 2017-08-14 MED ORDER — MELOXICAM 15 MG PO TABS: 15.0000 mg | ORAL_TABLET | Freq: Every day | ORAL | 0 refills | Status: DC

## 2017-08-14 MED ORDER — SERTRALINE HCL 50 MG PO TABS: 50.0000 mg | ORAL_TABLET | Freq: Every day | ORAL | 3 refills | Status: DC

## 2017-08-14 NOTE — Progress Notes (Signed)
Subjective:  Patient ID: Rachael Moyer, female    DOB: 05-09-1976  Age: 41 y.o. MRN: [id number]  CC: Abdominal Cramping  HPI Rachael Moyer is a 41 year old female with a PMH of anxiety, GERD, gastric ulcer, HA, bunion, and IBS who presents today for complaints of abdominal cramping. She states that this sharp pain started about two months ago and occurs twice-three times a week. It is located on her left pelvic and does not correlate with her menstrual cycle. She states that last month she had her period and it was less heavy than normal. After her menstrual cycle was done, she started having the pain. Her LMP was one week ago and she reports it being less heavy than usual. She denies fever, visualization of blood clots, and urinary symptoms. Denies any vaginal discharge. She is due for pap smear. Denies dyspareunia. She denies any suicidal ideation or thought  Past Medical History:  Diagnosis Date  . Anxiety   . Bunion   . Gastric ulcer   . GERD (gastroesophageal reflux disease)   . Headache   . Irritable bowel     Past Surgical History:  Procedure Laterality Date  . FOOT SURGERY       Outpatient Medications Prior to Visit  Medication Sig Dispense Refill  . aspirin-acetaminophen-caffeine (EXCEDRIN MIGRAINE) 250-250-65 MG tablet Take 2 tablets by mouth every 6 (six) hours as needed for headache.    . methocarbamol (ROBAXIN) 500 MG tablet Take 1 tablet (500 mg total) by mouth 4 (four) times daily. 120 tablet 0  . omeprazole (PRILOSEC) 40 MG capsule Take 1 capsule (40 mg total) by mouth daily. 30 capsule 3  . ranitidine (ZANTAC) 150 MG tablet Take 150 mg by mouth daily as needed for heartburn.    . clonazePAM (KLONOPIN) 0.5 MG tablet Take 1 tablet (0.5 mg total) by mouth at bedtime. 30 tablet 0  . meloxicam (MOBIC) 15 MG tablet Take 1 tablet (15 mg total) by mouth daily. 30 tablet 0  . sertraline (ZOLOFT) 50 MG tablet Take 1 tablet (50 mg total) by mouth daily. 30 tablet 3   No  facility-administered medications prior to visit.     ROS Review of Systems  Constitutional: Negative for activity change, fever and unexpected weight change.  Respiratory: Negative for cough and shortness of breath.   Cardiovascular: Negative for chest pain.  Gastrointestinal: Positive for abdominal pain and constipation. Negative for diarrhea.       Admits to bloating. Has bowel movements twice a week which is normal for her.   Genitourinary: Positive for menstrual problem, pelvic pain and vaginal bleeding. Negative for frequency, hematuria and urgency.  Musculoskeletal: Negative for back pain.  Neurological: Negative for numbness and headaches.  Psychiatric/Behavioral: Negative.     Objective:  BP 112/79 (BP Location: Left Arm, Patient Position: Sitting, Cuff Size: Large)   Pulse 81   Temp 98.3 F (36.8 C) (Oral)   Resp 18   Ht  (1.6 m)   Wt 198 lb (89.8 kg)   SpO2 97%   BMI 35.07 kg/m   BP/Weight 08/14/2017 05/16/2017 04/12/2017  Systolic BP 112 121 115  Diastolic BP 79 81 80  Wt. (Lbs) 198 195.2 188.8  BMI 35.07 33.51 32.41   Physical Exam  Constitutional: She is oriented to person, place, and time. She appears well-developed. No distress.  Neck: Normal range of motion. Neck supple.  Cardiovascular: Normal rate, regular rhythm and normal heart sounds.   No murmur heard.  Pulmonary/Chest: Effort normal and breath sounds normal. No respiratory distress. She has no wheezes.  Abdominal: Soft. Bowel sounds are normal. There is tenderness in the right lower quadrant, suprapubic area and left lower quadrant. There is CVA tenderness. There is no rebound.  Genitourinary: Uterus normal. There is no rash or lesion on the right labia. There is no rash or lesion on the left labia. Vaginal discharge found.  Genitourinary Comments: Moderate amount of thick white discharged noted near cervix.  Musculoskeletal: She exhibits no edema.  Lower back pain  Neurological: She is alert and  oriented to person, place, and time.  Psychiatric: She has a normal mood and affect.   Assessment & Plan:   1. Abdominal cramping  - Urinalysis Dipstick - meloxicam (MOBIC) 15 MG tablet; Take 1 tablet (15 mg total) by mouth daily.  Dispense: 30 tablet; Refill: 0 - US Pelvis Complete; Future - US Transvaginal Non-OB; Future  2. Pap smear for cervical cancer screening  - Cytology - PAP  3. Generalized anxiety disorder Controlled. - sertraline (ZOLOFT) 50 MG tablet; Take 1 tablet (50 mg total) by mouth daily.  Dispense: 30 tablet; Refill: 3 - clonazePAM (KLONOPIN) 0.5 MG tablet; Take 1 tablet (0.5 mg total) by mouth at bedtime.  Dispense: 30 tablet; Refill: 0  Meds ordered this encounter  Medications  . sertraline (ZOLOFT) 50 MG tablet    Sig: Take 1 tablet (50 mg total) by mouth daily.    Dispense:  30 tablet    Refill:  3  . clonazePAM (KLONOPIN) 0.5 MG tablet    Sig: Take 1 tablet (0.5 mg total) by mouth at bedtime.    Dispense:  30 tablet    Refill:  0  . meloxicam (MOBIC) 15 MG tablet    Sig: Take 1 tablet (15 mg total) by mouth daily.    Dispense:  30 tablet    Refill:  0    Follow-up: Return in about 3 months (around 11/14/2017) for Follow up Pain and comorbidities.   Evaluation and management procedures were performed by me with DNP Student in attendance, note written by DNP student under my supervision and collaboration. I have reviewed the note and I agree with the management and plan.  Jeanann Lewandowsky, MD, MHA, CPE, FACP, FAAP [facility] [place], [place] [phone]

## 2017-08-14 NOTE — Patient Instructions (Signed)
Pelvic Pain, Female °Pelvic pain is pain in your lower belly (abdomen), below your belly button and between your hips. The pain may start suddenly (acute), keep coming back (recurring), or last a long time (chronic). Pelvic pain that lasts longer than six months is considered chronic. There are many causes of pelvic pain. Sometimes the cause of your pelvic pain is not known. °Follow these instructions at home: °· Take over-the-counter and prescription medicines only as told by your doctor. °· Rest as told by your doctor. °· Do not have sex it if hurts. °· Keep a journal of your pelvic pain. Write down: °? When the pain started. °? Where the pain is located. °? What seems to make the pain better or worse, such as food or your menstrual cycle. °? Any symptoms you have along with the pain. °· Keep all follow-up visits as told by your doctor. This is important. °Contact a doctor if: °· Medicine does not help your pain. °· Your pain comes back. °· You have new symptoms. °· You have unusual vaginal discharge or bleeding. °· You have a fever or chills. °· You are having a hard time pooping (constipation). °· You have blood in your pee (urine) or poop (stool). °· Your pee smells bad. °· You feel weak or lightheaded. °Get help right away if: °· You have sudden pain that is very bad. °· Your pain continues to get worse. °· You have very bad pain and also have any of the following symptoms: °? A fever. °? Feeling stick to your stomach (nausea). °? Throwing up (vomiting). °? Being very sweaty. °· You pass out (lose consciousness). °This information is not intended to replace advice given to you by your health care provider. Make sure you discuss any questions you have with your health care provider. °Document Released: 04/16/2008 Document Revised: 11/23/2015 Document Reviewed: 08/19/2015 °Elsevier Interactive Patient Education © 2018 Elsevier Inc. ° °

## 2017-08-16 ENCOUNTER — Ambulatory Visit (HOSPITAL_COMMUNITY): Payer: Self-pay

## 2017-08-20 LAB — CYTOLOGY - PAP
BACTERIAL VAGINITIS: POSITIVE — AB
Candida vaginitis: NEGATIVE
Chlamydia: NEGATIVE
Diagnosis: NEGATIVE
HPV: NOT DETECTED
NEISSERIA GONORRHEA: NEGATIVE
TRICH (WINDOWPATH): NEGATIVE

## 2017-08-22 ENCOUNTER — Ambulatory Visit (HOSPITAL_COMMUNITY): Admission: RE | Admit: 2017-08-22 | Payer: Medicaid Other | Source: Ambulatory Visit

## 2017-09-06 ENCOUNTER — Other Ambulatory Visit: Payer: Self-pay | Admitting: Internal Medicine

## 2017-09-06 MED ORDER — METRONIDAZOLE 500 MG PO TABS
500.0000 mg | ORAL_TABLET | Freq: Two times a day (BID) | ORAL | 0 refills | Status: AC
Start: 2017-09-06 — End: 2017-09-13

## 2017-09-13 ENCOUNTER — Telehealth (INDEPENDENT_AMBULATORY_CARE_PROVIDER_SITE_OTHER): Payer: Self-pay | Admitting: *Deleted

## 2017-09-13 NOTE — Telephone Encounter (Signed)
-----   Message from Quentin Angstlugbemiga E Jegede, MD sent at 09/06/2017  3:10 PM EDT ----- Pap smear is negative for any lesion or malignancy. Vaginal swab is positive for bacterial vaginosis, not a sexually transmitted disease. Antibiotics sent to the pharmacy for pick-up.

## 2017-10-04 ENCOUNTER — Encounter (HOSPITAL_COMMUNITY): Payer: Self-pay | Admitting: Emergency Medicine

## 2017-10-04 ENCOUNTER — Emergency Department (HOSPITAL_COMMUNITY)
Admission: EM | Admit: 2017-10-04 | Discharge: 2017-10-04 | Disposition: A | Discharge status: Home / Self Care | Payer: Medicaid Other | Attending: Emergency Medicine | Admitting: Emergency Medicine

## 2017-10-04 ENCOUNTER — Emergency Department (HOSPITAL_COMMUNITY): Payer: Medicaid Other

## 2017-10-04 ENCOUNTER — Other Ambulatory Visit: Payer: Self-pay

## 2017-10-04 DIAGNOSIS — Z87891 Personal history of nicotine dependence: Secondary | ICD-10-CM | POA: Insufficient documentation

## 2017-10-04 DIAGNOSIS — Z79899 Other long term (current) drug therapy: Secondary | ICD-10-CM | POA: Insufficient documentation

## 2017-10-04 DIAGNOSIS — J40 Bronchitis, not specified as acute or chronic: Secondary | ICD-10-CM

## 2017-10-04 MED ORDER — DOXYCYCLINE HYCLATE 100 MG PO CAPS: 100.0000 mg | ORAL_CAPSULE | Freq: Two times a day (BID) | ORAL | 0 refills | Status: DC

## 2017-10-04 NOTE — ED Triage Notes (Signed)
Cough and URI symptoms x 1 week-- coughing until vomiting--

## 2017-10-04 NOTE — Discharge Instructions (Signed)
Make sure that you are getting plenty of rest and drinking a lot of fluids.  Use Tylenol for pain or fever.  Use Robitussin DM, for cough.

## 2017-10-04 NOTE — ED Provider Notes (Signed)
[facility] Provider Note   CSN: [id number] Arrival date & time: 10/04/17  1106     History   Chief Complaint Chief Complaint  Patient presents with  . Cough  . URI    HPI Rachael Orvan FalconerCampbell is a 41 y.o. female.  She is here for evaluation of cough, with occasional post tussive emesis.  She has been ill for 1 week.  When she coughs she produces green sputum.  She does not smoke cigarettes.  She is not currently working.  She denies dizziness or weakness.  There are no other known modifying factors.  HPI  Past Medical History:  Diagnosis Date  . Anxiety   . Bunion   . Gastric ulcer   . GERD (gastroesophageal reflux disease)   . Headache   . Irritable bowel     Patient Active Problem List   Diagnosis Date Noted  . Abdominal cramping 08/14/2017  . Pap smear for cervical cancer screening 08/14/2017  . Generalized anxiety disorder 05/16/2017  . Bunion     Past Surgical History:  Procedure Laterality Date  . FOOT SURGERY      OB History    No data available       Home Medications    Prior to Admission medications   Medication Sig Start Date End Date Taking? Authorizing Provider  aspirin-acetaminophen-caffeine (EXCEDRIN MIGRAINE) [id number] MG tablet Take 2 tablets by mouth every 6 (six) hours as needed for headache.    [provider]  clonazePAM (KLONOPIN) 0.5 MG tablet Take 1 tablet (0.5 mg total) by mouth at bedtime. 08/14/17   Quentin AngstJegede, Olugbemiga E, MD  doxycycline (VIBRAMYCIN) 100 MG capsule Take 1 capsule (100 mg total) by mouth 2 (two) times daily. One po bid x 7 days 10/04/17   Mancel BaleWentz, Yuki Purves, MD  meloxicam (MOBIC) 15 MG tablet Take 1 tablet (15 mg total) by mouth daily. 08/14/17   Quentin AngstJegede, Olugbemiga E, MD  methocarbamol (ROBAXIN) 500 MG tablet Take 1 tablet (500 mg total) by mouth 4 (four) times daily. 04/12/17   Loletta SpecterGomez, Roger David, PA-C  omeprazole (PRILOSEC) 40 MG capsule Take 1 capsule (40 mg total) by mouth  daily. 04/12/17   Loletta SpecterGomez, Roger David, PA-C  ranitidine (ZANTAC) 150 MG tablet Take 150 mg by mouth daily as needed for heartburn.    [provider]  sertraline (ZOLOFT) 50 MG tablet Take 1 tablet (50 mg total) by mouth daily. 08/14/17   Quentin AngstJegede, Olugbemiga E, MD    Family History No family history on file.  Social History Social History   Tobacco Use  . Smoking status: Former Smoker    Types: Cigarettes  . Smokeless tobacco: Never Used  Substance Use Topics  . Alcohol use: Yes  . Drug use: No     Allergies   Patient has no known allergies.   Review of Systems Review of Systems  All other systems reviewed and are negative.    Physical Exam Updated Vital Signs BP (!) 144/93 (BP Location: Right Arm)   Pulse 66   Temp 97.8 F (36.6 C) (Oral)   Resp 17   Ht 5\' 3"  (1.6 m)   Wt 82.6 kg (182 lb)   LMP 09/24/2017 (Exact Date)   SpO2 100%   BMI 32.24 kg/m   Physical Exam  Constitutional: She is oriented to person, place, and time. She appears well-developed and well-nourished. No distress.  HENT:  Head: Normocephalic and atraumatic.  Eyes: Conjunctivae and EOM are normal. Pupils  are equal, round, and reactive to light.  Neck: Normal range of motion and phonation normal. Neck supple.  Cardiovascular: Normal rate and regular rhythm.  Pulmonary/Chest: Effort normal and breath sounds normal. No stridor. No respiratory distress. She has no wheezes. She exhibits no tenderness.  Abdominal: Soft. She exhibits no distension. There is no tenderness. There is no guarding.  Musculoskeletal: Normal range of motion.  Neurological: She is alert and oriented to person, place, and time. She exhibits normal muscle tone.  Skin: Skin is warm and dry.  Psychiatric: She has a normal mood and affect. Her behavior is normal. Judgment and thought content normal.  Nursing note and vitals reviewed.    ED Treatments / Results  Labs (all labs ordered are listed, but only abnormal  results are displayed) Labs Reviewed - No data to display  EKG  EKG Interpretation None       Radiology Dg Chest 2 View  Result Date: 10/04/2017 CLINICAL DATA:  Shortness of breath on exertion with cough EXAM: CHEST  2 VIEW COMPARISON:  01/20/2016 FINDINGS: The heart size and mediastinal contours are within normal limits. Both lungs are clear. The visualized skeletal structures are unremarkable. IMPRESSION: No active cardiopulmonary disease. Electronically Signed   By: Alcide CleverMark  Lukens M.D.   On: 10/04/2017 12:02    Procedures Procedures (including critical care time)  Medications Ordered in ED Medications - No data to display   Initial Impression / Assessment and Plan / ED Course  I have reviewed the triage vital signs and the nursing notes.  Pertinent labs & imaging results that were available during my care of the patient were reviewed by me and considered in my medical decision making (see chart for details).  Clinical Course as of Oct 04 1338  Fri Oct 04, 2017  1332 No acute disease DG Chest 2 View [EW]  1339 DG Chest 2 View [EW]    Clinical Course User Index [EW] Mancel BaleWentz, Anquan Azzarello, MD     Patient Vitals for the past 24 hrs:  BP Temp Temp src Pulse Resp SpO2 Height Weight  10/04/17 1116 (!) 144/93 97.8 F (36.6 C) Oral 66 17 100 % 5\' 3"  (1.6 m) 82.6 kg (182 lb)    1:37 PM Reevaluation with update and discussion. After initial assessment and treatment, an updated evaluation reveals no change in clinical status.  Findings discussed with patient and all questions were answered. Mancel BaleElliott Qusai Kem      Final Clinical Impressions(s) / ED Diagnoses   Final diagnoses:  Bronchitis    Evaluation is consistent with nonspecific bronchitis.  Doubt pneumonia, PE or ACS.  Nursing Notes Reviewed/ Care Coordinated Applicable Imaging Reviewed Interpretation of Laboratory Data incorporated into ED treatment  The patient appears reasonably screened and/or stabilized for discharge  and I doubt any other medical condition or other [facility] requiring further screening, evaluation, or treatment in the ED at this time prior to discharge.  Plan: Home Medications-OTC, as needed; Home Treatments-rest, fluids, gradually advance diet; return here if the recommended treatment, does not improve the symptoms; Recommended follow up-PCP as needed   ED Discharge Orders        Ordered    doxycycline (VIBRAMYCIN) 100 MG capsule  2 times daily     10/04/17 1338       Mancel BaleWentz, Junious Ragone, MD 10/04/17 1339

## 2017-10-28 ENCOUNTER — Emergency Department (HOSPITAL_COMMUNITY)
Admission: EM | Admit: 2017-10-28 | Discharge: 2017-10-28 | Disposition: A | Discharge status: Home / Self Care | Payer: Medicaid Other | Attending: Emergency Medicine | Admitting: Emergency Medicine

## 2017-10-28 ENCOUNTER — Other Ambulatory Visit: Payer: Self-pay

## 2017-10-28 ENCOUNTER — Encounter (HOSPITAL_COMMUNITY): Payer: Self-pay | Admitting: Neurology

## 2017-10-28 DIAGNOSIS — Z87891 Personal history of nicotine dependence: Secondary | ICD-10-CM | POA: Insufficient documentation

## 2017-10-28 DIAGNOSIS — M5481 Occipital neuralgia: Secondary | ICD-10-CM | POA: Insufficient documentation

## 2017-10-28 DIAGNOSIS — Z79899 Other long term (current) drug therapy: Secondary | ICD-10-CM | POA: Insufficient documentation

## 2017-10-28 MED ORDER — ONDANSETRON 4 MG PO TBDP
4.0000 mg | ORAL_TABLET | Freq: Once | ORAL | Status: AC
  Administered 2017-10-28: 4 mg via ORAL
  Filled 2017-10-28: qty 1

## 2017-10-28 MED ORDER — DIPHENHYDRAMINE HCL 50 MG/ML IJ SOLN
25.0000 mg | Freq: Once | INTRAMUSCULAR | Status: AC
  Administered 2017-10-28: 25 mg via INTRAVENOUS
  Filled 2017-10-28: qty 1

## 2017-10-28 MED ORDER — METOCLOPRAMIDE HCL 5 MG/ML IJ SOLN
10.0000 mg | Freq: Once | INTRAMUSCULAR | Status: AC
  Administered 2017-10-28: 10 mg via INTRAVENOUS
  Filled 2017-10-28: qty 2

## 2017-10-28 MED ORDER — LIDOCAINE-EPINEPHRINE (PF) 2 %-1:200000 IJ SOLN
10.0000 mL | Freq: Once | INTRAMUSCULAR | Status: AC
  Administered 2017-10-28: 10 mL
  Filled 2017-10-28: qty 20

## 2017-10-28 MED ORDER — DEXAMETHASONE SODIUM PHOSPHATE 10 MG/ML IJ SOLN
10.0000 mg | Freq: Once | INTRAMUSCULAR | Status: AC
  Administered 2017-10-28: 10 mg via INTRAVENOUS
  Filled 2017-10-28: qty 1

## 2017-10-28 NOTE — Discharge Instructions (Addendum)
Thank you for allowing us to provide your care. Neurology should be contacting you within a few days to schedule an appointment. Please call your primary care doctor as soon as possible.

## 2017-10-28 NOTE — ED Provider Notes (Signed)
[facility] Provider Note   CSN: [id number] Arrival date & time: 10/28/17  [id number]  History   Chief Complaint Chief Complaint  Patient presents with  . Headache   HPI Rachael Moyer is a 41 y.o. female with migraine headaches who presented to the ED with right-sided sharp/stabbing pain of five days duration. She states that the pain is constant but she states that there are periods that it gets worse. She states that this headache is different than her typical migraine and that usually she can take Excedrin with complete resolution of her migraines. She has tried this time without relief. Associated symptoms include dizziness/lightheadedness and generalize weakness. She denies increased lacrimation, pain with eye movement, visual changes, photophobia, phonophobia, new rash, oral lesions, recent illness, SOB, cough, rhinorrhea, recent trauma to the neck.    Past Medical History:  Diagnosis Date  . Anxiety   . Bunion   . Gastric ulcer   . GERD (gastroesophageal reflux disease)   . Headache   . Irritable bowel    Patient Active Problem List   Diagnosis Date Noted  . Abdominal cramping 08/14/2017  . Pap smear for cervical cancer screening 08/14/2017  . Generalized anxiety disorder 05/16/2017  . Bunion    Past Surgical History:  Procedure Laterality Date  . FOOT SURGERY     OB History    No data available     Home Medications    Prior to Admission medications   Medication Sig Start Date End Date Taking? Authorizing Provider  aspirin-acetaminophen-caffeine (EXCEDRIN MIGRAINE) [phone] [id number] MG tablet Take 2 tablets by mouth every 6 (six) hours as needed for headache.    [provider]  clonazePAM (KLONOPIN) 0.5 MG tablet Take 1 tablet (0.5 mg total) by mouth at bedtime. 08/14/17   Quentin AngstJegede, Olugbemiga E, MD  doxycycline (VIBRAMYCIN) 100 MG capsule Take 1 capsule (100 mg total) by mouth 2 (two) times daily. One po bid x 7 days 10/04/17    Mancel BaleWentz, Elliott, MD  meloxicam (MOBIC) 15 MG tablet Take 1 tablet (15 mg total) by mouth daily. 08/14/17   Quentin AngstJegede, Olugbemiga E, MD  methocarbamol (ROBAXIN) 500 MG tablet Take 1 tablet (500 mg total) by mouth 4 (four) times daily. 04/12/17   Loletta SpecterGomez, Roger David, PA-C  omeprazole (PRILOSEC) 40 MG capsule Take 1 capsule (40 mg total) by mouth daily. 04/12/17   Loletta SpecterGomez, Roger David, PA-C  ranitidine (ZANTAC) 150 MG tablet Take 150 mg by mouth daily as needed for heartburn.    [provider]  sertraline (ZOLOFT) 50 MG tablet Take 1 tablet (50 mg total) by mouth daily. 08/14/17   Quentin AngstJegede, Olugbemiga E, MD   Family History No family history on file.  Social History Social History   Tobacco Use  . Smoking status: Former Smoker    Types: Cigarettes  . Smokeless tobacco: Never Used  Substance Use Topics  . Alcohol use: Yes  . Drug use: No   Allergies   Patient has no known allergies.  Review of Systems All systems reviewed and are negative for acute change except as noted in the HPI.  Physical Exam Updated Vital Signs BP 132/67 (BP Location: Right Arm)   Pulse (!) 58   Temp 98.2 F (36.8 C) (Oral)   Resp 18   LMP 10/26/2017   SpO2 99%   General: Well nourished female in no acute idstress HENT: Normocephalic, atraumatic, moist mucus membranes, oropharynx clear with oral lesions, TM are clear with good light reflex  bilaterally, pain is reproducible with palpation of the right mastoid process, nares are nor erythematous Pulm: Good air movement with no wheezing or crackles  CV: RRR, no murmurs, no rubs Abdomen: Active bowel sounds, soft, non-distended, no tenderness to palpation  Extremities: No LE edema, radial pulses intact bilaterally  Skin: Warm and dry, no rash or lesions noted Neuro: Alert and oriented x 3, Cranial nerves intact bilaterally, Gross strength 5/5 in all extremities, Sensation to light touch intact in all extremities, HiNTs exam negative  ED Treatments / Results    Labs (all labs ordered are listed, but only abnormal results are displayed) Labs Reviewed - No data to display  EKG  EKG Interpretation None      Radiology No results found.  Procedures Procedures (including critical care time)  Medications Ordered in ED Medications  lidocaine-EPINEPHrine (XYLOCAINE W/EPI) 2 %-1:200000 (PF) injection 10 mL (10 mLs Infiltration Given by Other 10/28/17 1604)  ondansetron (ZOFRAN-ODT) disintegrating tablet 4 mg (4 mg Oral Given 10/28/17 1522)  dexamethasone (DECADRON) injection 10 mg (10 mg Intravenous Given 10/28/17 1627)  metoCLOPramide (REGLAN) injection 10 mg (10 mg Intravenous Given 10/28/17 1627)  diphenhydrAMINE (BENADRYL) injection 25 mg (25 mg Intravenous Given 10/28/17 1627)   Initial Impression / Assessment and Plan / ED Course  I have reviewed the triage vital signs and the nursing notes.  Pertinent labs & imaging results that were available during my care of the patient were reviewed by me and considered in my medical decision making (see chart for details).    41 y.o female with frequent migraines presenting to the ED with 5 days of stabbing right-side headaches that radiate from the right mastoid process to around the right eye. Associated symptoms include dizziness/lightheadedness and generalize weakness. Neurological exam and HiNTs exam is negative. At this point it is less likely to be a cluster headaches, tension headaches, posterior circulation stroke, arteritis, varicella, or vertebral artery dissection. The most likely diagnosis at this point is occipital neuralgia.   Attempted occipital nerve block without relief. Peripheral IV insert and patient treated with Decadron, Reglan, and Benadryl with significant improvement in her symptoms. We discussed that she will need to see her PCP as soon as possible and we have put in for a referral to a neurologist. She voices understanding and agrees with discharge. Standard return precautions  given. She is medically stable for discharge.  Final Clinical Impressions(s) / ED Diagnoses   Final diagnoses:  Occipital neuralgia of right side   ED Discharge Orders        Ordered    Ambulatory referral to Neurology    Comments:  An appointment is requested in approximately: days   10/28/17 1604       Levora DredgeHelberg, Rondrick Barreira, MD 10/28/17 1701    Gwyneth SproutPlunkett, Whitney, MD 10/28/17 2150

## 2017-10-28 NOTE — ED Triage Notes (Addendum)
Pt reports h/a x 1 week. Started as pain to right side of neck, then localized to right ear, now pain to entire head. Got worse this morning. Feels nauseated. Does suffer from h/a 4-5/times a week. Is a x 4. Denies falls. Does report marijuana has been helping with pain.

## 2017-11-13 ENCOUNTER — Ambulatory Visit (INDEPENDENT_AMBULATORY_CARE_PROVIDER_SITE_OTHER): Payer: Self-pay | Admitting: Physician Assistant

## 2017-12-10 ENCOUNTER — Ambulatory Visit (INDEPENDENT_AMBULATORY_CARE_PROVIDER_SITE_OTHER): Payer: Medicaid Other | Admitting: Physician Assistant

## 2018-01-08 ENCOUNTER — Ambulatory Visit: Payer: Self-pay | Admitting: Neurology

## 2018-01-09 ENCOUNTER — Encounter: Payer: Self-pay | Admitting: Neurology

## 2018-09-17 ENCOUNTER — Emergency Department (HOSPITAL_COMMUNITY)
Admission: EM | Admit: 2018-09-17 | Discharge: 2018-09-17 | Disposition: A | Discharge status: Home / Self Care | Payer: Medicaid Other | Attending: Emergency Medicine | Admitting: Emergency Medicine

## 2018-09-17 ENCOUNTER — Encounter (HOSPITAL_COMMUNITY): Payer: Self-pay

## 2018-09-17 ENCOUNTER — Other Ambulatory Visit: Payer: Self-pay

## 2018-09-17 DIAGNOSIS — Z7982 Long term (current) use of aspirin: Secondary | ICD-10-CM | POA: Insufficient documentation

## 2018-09-17 DIAGNOSIS — B349 Viral infection, unspecified: Secondary | ICD-10-CM | POA: Insufficient documentation

## 2018-09-17 LAB — CBC
HCT: 37.2 % (ref 36.0–46.0)
HEMOGLOBIN: 11.2 g/dL — AB (ref 12.0–15.0)
MCH: 26.6 pg (ref 26.0–34.0)
MCHC: 30.1 g/dL (ref 30.0–36.0)
MCV: 88.4 fL (ref 80.0–100.0)
NRBC: 0 % (ref 0.0–0.2)
PLATELETS: 384 10*3/uL (ref 150–400)
RBC: 4.21 MIL/uL (ref 3.87–5.11)
RDW: 14.1 % (ref 11.5–15.5)
WBC: 7.7 10*3/uL (ref 4.0–10.5)

## 2018-09-17 LAB — URINALYSIS, ROUTINE W REFLEX MICROSCOPIC
Bilirubin Urine: NEGATIVE
GLUCOSE, UA: NEGATIVE mg/dL
HGB URINE DIPSTICK: NEGATIVE
Ketones, ur: NEGATIVE mg/dL
NITRITE: NEGATIVE
PH: 6 (ref 5.0–8.0)
Protein, ur: NEGATIVE mg/dL
SPECIFIC GRAVITY, URINE: 1.02 (ref 1.005–1.030)

## 2018-09-17 LAB — LIPASE, BLOOD: Lipase: 26 U/L (ref 11–51)

## 2018-09-17 LAB — COMPREHENSIVE METABOLIC PANEL
ALBUMIN: 4 g/dL (ref 3.5–5.0)
ALT: 10 U/L (ref 0–44)
ANION GAP: 8 (ref 5–15)
AST: 14 U/L — ABNORMAL LOW (ref 15–41)
Alkaline Phosphatase: 55 U/L (ref 38–126)
BUN: 8 mg/dL (ref 6–20)
CALCIUM: 8.8 mg/dL — AB (ref 8.9–10.3)
CHLORIDE: 107 mmol/L (ref 98–111)
CO2: 23 mmol/L (ref 22–32)
Creatinine, Ser: 0.76 mg/dL (ref 0.44–1.00)
GFR calc non Af Amer: >60 mL/min (ref >60)
Glucose, Bld: 96 mg/dL (ref 70–99)
Potassium: 3.9 mmol/L (ref 3.5–5.1)
SODIUM: 138 mmol/L (ref 135–145)
Total Bilirubin: 0.6 mg/dL (ref 0.3–1.2)
Total Protein: 7.3 g/dL (ref 6.5–8.1)

## 2018-09-17 LAB — I-STAT BETA HCG BLOOD, ED (MC, WL, AP ONLY): I-stat hCG, quantitative: <5 m[IU]/mL (ref <5)

## 2018-09-17 NOTE — ED Notes (Signed)
Pt given ginger ale.

## 2018-09-17 NOTE — ED Triage Notes (Signed)
Pt states that she developed a cough at work on Saturday. Pt states she then started taking OTC cold meds. Pt states that she has had a productive cough with productive phelgm. Pt states that she has had dark urine and had diarrhea.  Pt states that she has had 6-7 episodes of watery diarrhea in the past 24 hours.

## 2018-09-17 NOTE — ED Provider Notes (Signed)
[facility]-EMERGENCY DEPT Provider Note   CSN: [id number] Arrival date & time: 09/17/18  4696     History   Chief Complaint Chief Complaint  Patient presents with  . Cough  . Diarrhea    HPI Rachael Moyer is a 42 y.o. female.  HPI   She has been ill for several days, with cough, sore throat, diarrhea and malaise.  She denies nausea or vomiting.  She was unable to work today because of persistent symptoms so decided to come here.  She works as a Advertising copywriter at [employer].  She does not take medications, chronically.  There are no other known modifying factors.  Past Medical History:  Diagnosis Date  . Anxiety   . Bunion   . Gastric ulcer   . GERD (gastroesophageal reflux disease)   . Headache   . Irritable bowel     Patient Active Problem List   Diagnosis Date Noted  . Abdominal cramping 08/14/2017  . Pap smear for cervical cancer screening 08/14/2017  . Generalized anxiety disorder 05/16/2017  . Bunion     Past Surgical History:  Procedure Laterality Date  . FOOT SURGERY       OB History   None      Home Medications    Prior to Admission medications   Medication Sig Start Date End Date Taking? Authorizing Provider  aspirin-acetaminophen-caffeine (EXCEDRIN MIGRAINE) [phone] MG tablet Take 2 tablets by mouth every 6 (six) hours as needed for headache.   Yes [provider]  ranitidine (ZANTAC) 150 MG tablet Take 150 mg by mouth daily as needed for heartburn.   Yes [provider]  clonazePAM (KLONOPIN) 0.5 MG tablet Take 1 tablet (0.5 mg total) by mouth at bedtime. Patient not taking: Reported on 09/17/2018 08/14/17   Quentin Angst, MD  doxycycline (VIBRAMYCIN) 100 MG capsule Take 1 capsule (100 mg total) by mouth 2 (two) times daily. One po bid x 7 days Patient not taking: Reported on 09/17/2018 10/04/17   Mancel Bale, MD  meloxicam (MOBIC) 15 MG tablet Take 1 tablet (15 mg total) by mouth  daily. Patient not taking: Reported on 09/17/2018 08/14/17   Quentin Angst, MD  methocarbamol (ROBAXIN) 500 MG tablet Take 1 tablet (500 mg total) by mouth 4 (four) times daily. Patient not taking: Reported on 09/17/2018 04/12/17   Loletta Specter, PA-C  omeprazole (PRILOSEC) 40 MG capsule Take 1 capsule (40 mg total) by mouth daily. Patient not taking: Reported on 09/17/2018 04/12/17   Loletta Specter, PA-C  sertraline (ZOLOFT) 50 MG tablet Take 1 tablet (50 mg total) by mouth daily. Patient not taking: Reported on 09/17/2018 08/14/17   Quentin Angst, MD    Family History No family history on file.  Social History Social History   Tobacco Use  . Smoking status: Former Smoker    Types: Cigarettes  . Smokeless tobacco: Never Used  Substance Use Topics  . Alcohol use: Yes    Comment: 1/month  . Drug use: No     Allergies   Patient has no known allergies.   Review of Systems Review of Systems  All other systems reviewed and are negative.    Physical Exam Updated Vital Signs BP 120/82   Pulse 73   Temp 98.5 F (36.9 C) (Oral)   Resp 19   Ht 5\' 3"  (1.6 m)   Wt 84.8 kg   LMP 08/20/2018   SpO2 100%   BMI 33.13 kg/m  Physical Exam  Constitutional: She is oriented to person, place, and time. She appears well-developed and well-nourished. No distress.  HENT:  Head: Normocephalic and atraumatic.  Right Ear: External ear normal.  Left Ear: External ear normal.  Nose: Nose normal.  Mouth/Throat: No oropharyngeal exudate.  Moist oral mucous membranes  Eyes: Pupils are equal, round, and reactive to light. Conjunctivae and EOM are normal.  Neck: Normal range of motion and phonation normal. Neck supple.  Cardiovascular: Normal rate and regular rhythm.  Pulmonary/Chest: Effort normal and breath sounds normal. No respiratory distress. She exhibits no tenderness.  Occasional nonproductive cough  Abdominal: Soft. She exhibits no distension. There is no  tenderness. There is no guarding.  Musculoskeletal: Normal range of motion. She exhibits no edema or deformity.  Neurological: She is alert and oriented to person, place, and time. She exhibits normal muscle tone.  Skin: Skin is warm and dry.  Psychiatric: She has a normal mood and affect. Her behavior is normal. Judgment and thought content normal.  Nursing note and vitals reviewed.    ED Treatments / Results  Labs (all labs ordered are listed, but only abnormal results are displayed) Labs Reviewed  COMPREHENSIVE METABOLIC PANEL - Abnormal; Notable for the following components:      Result Value   Calcium 8.8 (*)    AST 14 (*)    All other components within normal limits  CBC - Abnormal; Notable for the following components:   Hemoglobin 11.2 (*)    All other components within normal limits  URINALYSIS, ROUTINE W REFLEX MICROSCOPIC - Abnormal; Notable for the following components:   Leukocytes, UA MODERATE (*)    Bacteria, UA RARE (*)    All other components within normal limits  LIPASE, BLOOD  I-STAT BETA HCG BLOOD, ED ([facility], [facility], [facility] ONLY)    EKG None  Radiology No results found.  Procedures Procedures (including critical care time)  Medications Ordered in ED Medications - No data to display   Initial Impression / Assessment and Plan / ED Course  I have reviewed the triage vital signs and the nursing notes.  Pertinent labs & imaging results that were available during my care of the patient were reviewed by me and considered in my medical decision making (see chart for details).  Clinical Course as of Sep 17 1253  Wed Sep 17, 2018  1249 Normal except leukocytes present, and rare bacteria, with elevated squamous cells.  Urinalysis, Routine w reflex microscopic(!) [EW]  1251 Normal  I-Stat beta hCG blood, ED [EW]  1251 Normal  Lipase, blood [EW]  1251 Normal except hemoglobin low  CBC(!) [EW]  1251 Normal except calcium low, AST low  Comprehensive metabolic  panel(!) [EW]    Clinical Course User Index [EW] Mancel Bale, MD     Patient Vitals for the past 24 hrs:  BP Temp Temp src Pulse Resp SpO2 Height Weight  09/17/18 1130 120/82 - - 73 19 100 % - -  09/17/18 0913 - - - - - - 5\' 3"  (1.6 m) 84.8 kg  09/17/18 0912 125/85 98.5 F (36.9 C) Oral 88 16 100 % - -    12:51 PM Reevaluation with update and discussion. After initial assessment and treatment, an updated evaluation reveals no additional complaints, findings discussed with the patient, and all questions were answered. Mancel Bale   Medical Decision Making: Evaluation consistent with viral syndrome, patient with cough, and diarrhea as primary complaints.  Doubt serious bacterial infection, metabolic instability or impending  vascular collapse.  CRITICAL CARE-no Performed by: Mancel Bale  Nursing Notes Reviewed/ Care Coordinated Applicable Imaging Reviewed Interpretation of Laboratory Data incorporated into ED treatment  The patient appears reasonably screened and/or stabilized for discharge and I doubt any other medical condition or other [facility] requiring further screening, evaluation, or treatment in the ED at this time prior to discharge.  Plan: Home Medications-OTC symptomatic treatment as needed; Home Treatments-rest, fluids; return here if the recommended treatment, does not improve the symptoms; Recommended follow up-PCP, PRN   Final Clinical Impressions(s) / ED Diagnoses   Final diagnoses:  Viral illness    ED Discharge Orders    None       Mancel Bale, MD 09/17/18 1254

## 2018-09-17 NOTE — Discharge Instructions (Addendum)
Your symptoms appear to be caused by a virus.  Make sure that you are getting plenty of rest, and drinking a lot of fluids.  For cough you can use Robitussin-DM.  For diarrhea, try Kaopectate or Imodium.  Follow-up with the doctor of your choice if not better in 3 to 5 days.

## 2019-12-18 ENCOUNTER — Encounter (HOSPITAL_COMMUNITY): Payer: Self-pay | Admitting: Emergency Medicine

## 2019-12-18 ENCOUNTER — Other Ambulatory Visit: Payer: Self-pay

## 2019-12-18 ENCOUNTER — Emergency Department (HOSPITAL_COMMUNITY)
Admission: EM | Admit: 2019-12-18 | Discharge: 2019-12-18 | Disposition: A | Discharge status: Home / Self Care | Payer: Medicaid Other | Attending: Emergency Medicine | Admitting: Emergency Medicine

## 2019-12-18 DIAGNOSIS — R21 Rash and other nonspecific skin eruption: Secondary | ICD-10-CM | POA: Insufficient documentation

## 2019-12-18 DIAGNOSIS — Z87891 Personal history of nicotine dependence: Secondary | ICD-10-CM | POA: Insufficient documentation

## 2019-12-18 MED ORDER — TRIAMCINOLONE ACETONIDE 0.1 % EX CREA: 1.0000 "application " | TOPICAL_CREAM | Freq: Two times a day (BID) | CUTANEOUS | 0 refills | Status: DC

## 2019-12-18 MED ORDER — PREDNISONE 20 MG PO TABS: 40.0000 mg | ORAL_TABLET | Freq: Every day | ORAL | 0 refills | Status: AC

## 2019-12-18 MED ORDER — PERMETHRIN 5 % EX CREA: TOPICAL_CREAM | CUTANEOUS | 0 refills | Status: DC

## 2019-12-18 NOTE — ED Provider Notes (Signed)
[facility] EMERGENCY DEPARTMENT Provider Note   CSN: [id number] Arrival date & time: 12/18/19  8119     History Chief Complaint  Patient presents with  . Rash    Rachael Moyer is a 44 y.o. female with a past medical history significant for irritable bowel syndrome, GERD, gastric ulcer, and anxiety who presents to the ED due to gradual onset of worsening pruritic rash since January.  Patient states that rash started on her chest and began to spread downward onto her entire trunk and extremities.  Patient denies new detergents, new lotions, new medications, and new soaps.  Patient notes she has animals at home, but does not believe they have any fleas.  No other family members that the patient lives with has a similar rash.  Patient has tried cortisone cream with no relief.  Rash is associated with pruritus and tingling sensation.  Patient denies drainage from rash.  Patient denies fever, chills, URI symptoms, abdominal pain, diarrhea, chest pain, and shortness of breath. Patient denies rash in genital region.     Past Medical History:  Diagnosis Date  . Anxiety   . Bunion   . Gastric ulcer   . GERD (gastroesophageal reflux disease)   . Headache   . Irritable bowel     Patient Active Problem List   Diagnosis Date Noted  . Abdominal cramping 08/14/2017  . Pap smear for cervical cancer screening 08/14/2017  . Generalized anxiety disorder 05/16/2017  . Bunion     Past Surgical History:  Procedure Laterality Date  . FOOT SURGERY       OB History   No obstetric history on file.     No family history on file.  Social History   Tobacco Use  . Smoking status: Former Smoker    Types: Cigarettes  . Smokeless tobacco: Never Used  Substance Use Topics  . Alcohol use: Yes    Comment: 1/month  . Drug use: No    Home Medications Prior to Admission medications   Medication Sig Start Date End Date Taking? Authorizing Provider   aspirin-acetaminophen-caffeine (EXCEDRIN MIGRAINE) [phone] MG tablet Take 2 tablets by mouth every 6 (six) hours as needed for headache.    [provider]  clonazePAM (KLONOPIN) 0.5 MG tablet Take 1 tablet (0.5 mg total) by mouth at bedtime. Patient not taking: Reported on 09/17/2018 08/14/17   Quentin Angst, MD  doxycycline (VIBRAMYCIN) 100 MG capsule Take 1 capsule (100 mg total) by mouth 2 (two) times daily. One po bid x 7 days Patient not taking: Reported on 09/17/2018 10/04/17   Mancel Bale, MD  meloxicam (MOBIC) 15 MG tablet Take 1 tablet (15 mg total) by mouth daily. Patient not taking: Reported on 09/17/2018 08/14/17   Quentin Angst, MD  methocarbamol (ROBAXIN) 500 MG tablet Take 1 tablet (500 mg total) by mouth 4 (four) times daily. Patient not taking: Reported on 09/17/2018 04/12/17   Loletta Specter, PA-C  omeprazole (PRILOSEC) 40 MG capsule Take 1 capsule (40 mg total) by mouth daily. Patient not taking: Reported on 09/17/2018 04/12/17   Loletta Specter, PA-C  ranitidine (ZANTAC) 150 MG tablet Take 150 mg by mouth daily as needed for heartburn.    [provider]  sertraline (ZOLOFT) 50 MG tablet Take 1 tablet (50 mg total) by mouth daily. Patient not taking: Reported on 09/17/2018 08/14/17   Quentin Angst, MD    Allergies    Patient has no known allergies.  Review  of Systems   Review of Systems  Constitutional: Negative for chills and fever.  HENT: Negative.   Respiratory: Negative for shortness of breath.   Cardiovascular: Negative for chest pain.  Gastrointestinal: Negative for abdominal pain, diarrhea, nausea and vomiting.  Skin: Positive for color change and rash.    Physical Exam Updated Vital Signs BP 132/89 (BP Location: Right Arm)   Pulse 73   Temp 98.8 F (37.1 C) (Oral)   Resp 20   Ht 5\' 3"  (1.6 m)   Wt 84.4 kg   SpO2 99%   BMI 32.95 kg/m   Physical Exam Vitals and nursing note reviewed.  Constitutional:       General: She is not in acute distress.    Appearance: She is not ill-appearing.  HENT:     Head: Normocephalic.  Eyes:     Pupils: Pupils are equal, round, and reactive to light.  Cardiovascular:     Rate and Rhythm: Normal rate and regular rhythm.     Pulses: Normal pulses.     Heart sounds: Normal heart sounds. No murmur. No friction rub. No gallop.   Pulmonary:     Effort: Pulmonary effort is normal.     Breath sounds: Normal breath sounds.  Abdominal:     General: Abdomen is flat. Bowel sounds are normal. There is no distension.     Palpations: Abdomen is soft.  Musculoskeletal:     Cervical back: Neck supple.     Comments: Able to move all 4 extremities without difficulty.   Skin:    Comments: Numerous erythematous papules on trunk and extremities.  No petechiae or purpura.  No drainage. Rash does not involve palms and soles. See photo below.   Neurological:     General: No focal deficit present.     Mental Status: She is alert.  Psychiatric:        Mood and Affect: Mood normal.        Behavior: Behavior normal.       ED Results / Procedures / Treatments   Labs (all labs ordered are listed, but only abnormal results are displayed) Labs Reviewed - No data to display  EKG None  Radiology No results found.  Procedures Procedures (including critical care time)  Medications Ordered in ED Medications - No data to display  ED Course  I have reviewed the triage vital signs and the nursing notes.  Pertinent labs & imaging results that were available during my care of the patient were reviewed by me and considered in my medical decision making (see chart for details).    MDM Rules/Calculators/A&P                     44 year old female presents to the ED due to rash since January that has spread from her trunk downward to her extremities.  Rash is associated with pruritus and tingling sensation.  No other family members in her household have similar rash.  Patient  denies URI symptoms.  Denies fever and chills.  Vitals all within normal limits.  Patient is afebrile.  Patient in no acute distress and non-ill-appearing. Numerous erythematous papules on trunk and extremities.  No petechiae or purpura.  No drainage. Rash does not involve palms and soles. Doubt syphilis. See photo abov.  Will discharge patient with symptomatic treatment of triamcinolone cream and prednisone.  Doubt scabies, but will also prescribe permethrin if rash does not improve within the next week after prednisone treatment or if  family members begin to develop similar rash. [facility] number given to patient and advised to follow-up if rash does not improve within the next week. Strict ED precautions discussed with patient. Patient states understanding and agrees to plan. Patient discharged home in no acute distress and stable vitals.   Final Clinical Impression(s) / ED Diagnoses Final diagnoses:  Rash    Rx / DC Orders ED Discharge Orders    None       Jesusita Oka 12/18/19 1001    Gwyneth Sprout, MD 12/21/19 [phone]

## 2019-12-18 NOTE — Discharge Instructions (Addendum)
I am sending you home with oral steroids and a steroid cream. Complete entire course of oral steroids to see if there is improvement in rash. If there is no improve in rash over the next week or if your family members start to get the same rash, I have prescribed the treatment for scabies. Do not use if rash gets better within the next week. The directions for the scabies cream is below. I have included the number for [facility]. If the rash does not improve in the next week, follow-up with [facility]. Return to the ER if you develop difficulties breathing or worsening of symptoms.     Patients should massage permethrin cream thoroughly into the skin from the neck to the soles of the feet, including areas under the fingernails and toenails [7]. Thirty grams is usually sufficient for a single application for an average adult. In young children, scalp involvement is common. Therefore, permethrin should also be applied to the scalp and face (sparing the eyes and mouth) in this population. Permethrin should be removed by washing (shower or bath) after 8 to 14 hours. Treatment is often performed overnight.    A second application one to two weeks later may be necessary to eliminate mites and is typically performed [6,8]. However, the relative efficacy of one versus two applications of permethrin has not been studied.

## 2019-12-18 NOTE — ED Triage Notes (Signed)
Pt arrives POv with c/o of rash that began on her chest since January. The rash has spread to her arms, back and top of legs. Pt reports rash is very itchy and tried to use Cortizone without relief

## 2020-03-25 ENCOUNTER — Other Ambulatory Visit: Payer: Self-pay

## 2020-03-25 ENCOUNTER — Emergency Department (HOSPITAL_COMMUNITY): Payer: Self-pay

## 2020-03-25 ENCOUNTER — Emergency Department (HOSPITAL_COMMUNITY)
Admission: EM | Admit: 2020-03-25 | Discharge: 2020-03-25 | Disposition: A | Discharge status: Home / Self Care | Payer: Self-pay | Attending: Emergency Medicine | Admitting: Emergency Medicine

## 2020-03-25 ENCOUNTER — Encounter (HOSPITAL_COMMUNITY): Payer: Self-pay

## 2020-03-25 DIAGNOSIS — R42 Dizziness and giddiness: Secondary | ICD-10-CM | POA: Insufficient documentation

## 2020-03-25 DIAGNOSIS — Z79899 Other long term (current) drug therapy: Secondary | ICD-10-CM | POA: Insufficient documentation

## 2020-03-25 DIAGNOSIS — Z87891 Personal history of nicotine dependence: Secondary | ICD-10-CM | POA: Insufficient documentation

## 2020-03-25 DIAGNOSIS — R1110 Vomiting, unspecified: Secondary | ICD-10-CM | POA: Insufficient documentation

## 2020-03-25 DIAGNOSIS — K439 Ventral hernia without obstruction or gangrene: Secondary | ICD-10-CM | POA: Insufficient documentation

## 2020-03-25 LAB — CBC
HCT: 35.1 % — ABNORMAL LOW (ref 36.0–46.0)
Hemoglobin: 10.7 g/dL — ABNORMAL LOW (ref 12.0–15.0)
MCH: 27.2 pg (ref 26.0–34.0)
MCHC: 30.5 g/dL (ref 30.0–36.0)
MCV: 89.1 fL (ref 80.0–100.0)
Platelets: 416 10*3/uL — ABNORMAL HIGH (ref 150–400)
RBC: 3.94 MIL/uL (ref 3.87–5.11)
RDW: 14.2 % (ref 11.5–15.5)
WBC: 12.3 10*3/uL — ABNORMAL HIGH (ref 4.0–10.5)
nRBC: 0 % (ref 0.0–0.2)

## 2020-03-25 LAB — URINALYSIS, ROUTINE W REFLEX MICROSCOPIC
Bilirubin Urine: NEGATIVE
Glucose, UA: NEGATIVE mg/dL
Hgb urine dipstick: NEGATIVE
Ketones, ur: 5 mg/dL — AB
Leukocytes,Ua: NEGATIVE
Nitrite: NEGATIVE
Protein, ur: NEGATIVE mg/dL
Specific Gravity, Urine: 1.024 (ref 1.005–1.030)
pH: 6 (ref 5.0–8.0)

## 2020-03-25 LAB — LIPASE, BLOOD: Lipase: 21 U/L (ref 11–51)

## 2020-03-25 LAB — I-STAT BETA HCG BLOOD, ED (MC, WL, AP ONLY): I-stat hCG, quantitative: <5 m[IU]/mL (ref <5)

## 2020-03-25 LAB — COMPREHENSIVE METABOLIC PANEL
ALT: 12 U/L (ref 0–44)
AST: 15 U/L (ref 15–41)
Albumin: 4 g/dL (ref 3.5–5.0)
Alkaline Phosphatase: 73 U/L (ref 38–126)
Anion gap: 8 (ref 5–15)
BUN: 7 mg/dL (ref 6–20)
CO2: 25 mmol/L (ref 22–32)
Calcium: 9 mg/dL (ref 8.9–10.3)
Chloride: 104 mmol/L (ref 98–111)
Creatinine, Ser: 0.73 mg/dL (ref 0.44–1.00)
GFR calc Af Amer: >60 mL/min (ref >60)
GFR calc non Af Amer: >60 mL/min (ref >60)
Glucose, Bld: 88 mg/dL (ref 70–99)
Potassium: 3.2 mmol/L — ABNORMAL LOW (ref 3.5–5.1)
Sodium: 137 mmol/L (ref 135–145)
Total Bilirubin: 1 mg/dL (ref 0.3–1.2)
Total Protein: 7.3 g/dL (ref 6.5–8.1)

## 2020-03-25 MED ORDER — ONDANSETRON HCL 4 MG PO TABS: 4.0000 mg | ORAL_TABLET | Freq: Once | ORAL | Status: DC

## 2020-03-25 MED ORDER — ONDANSETRON HCL 4 MG/2ML IJ SOLN
4.0000 mg | Freq: Once | INTRAMUSCULAR | Status: AC
  Administered 2020-03-25: 4 mg via INTRAVENOUS

## 2020-03-25 MED ORDER — ONDANSETRON HCL 4 MG/2ML IJ SOLN
INTRAMUSCULAR | Status: AC
  Filled 2020-03-25: qty 2

## 2020-03-25 MED ORDER — SODIUM CHLORIDE 0.9% FLUSH
3.0000 mL | Freq: Once | INTRAVENOUS | Status: AC
  Administered 2020-03-25: 3 mL via INTRAVENOUS

## 2020-03-25 MED ORDER — IOHEXOL 300 MG/ML  SOLN
100.0000 mL | Freq: Once | INTRAMUSCULAR | Status: AC | PRN
  Administered 2020-03-25: 100 mL via INTRAVENOUS

## 2020-03-25 NOTE — ED Triage Notes (Signed)
Patient c/o mid abdominal pain, emesis , and dizziness x 2 1/2 weeks ago. Patient states she feels a lump in the area that is increasing in size.

## 2020-03-25 NOTE — ED Notes (Signed)
Pt up walking around the room. Family at bedside. NAD noted. Pt ready for d/c 

## 2020-03-25 NOTE — ED Provider Notes (Signed)
[facility]-EMERGENCY DEPT Provider Note   CSN: [id number] Arrival date & time: 03/25/20  1528     History Chief Complaint  Patient presents with  . Abdominal Pain  . Emesis  . Dizziness    Rachael Moyer is a 44 y.o. female.  44 year old female with prior medical history as detailed below presents for evaluation of abdominal discomfort.  Patient is complaining of epigastric pain and swelling.  She reports that this is been an ongoing problem for the last 1 to 2 weeks.  She does report associated nausea and vomiting.  She denies fever.  She denies bowel movement change.  She denies prior surgical history in the abdomen.  The history is provided by the patient and medical records.  Abdominal Pain Pain location:  Epigastric Pain quality: aching and cramping   Pain radiates to:  Does not radiate Pain severity:  Mild Onset quality:  Gradual Duration:  2 weeks Timing:  Constant Progression:  Waxing and waning Chronicity:  New Relieved by:  Nothing Worsened by:  Nothing Associated symptoms: vomiting   Emesis Associated symptoms: abdominal pain   Dizziness Associated symptoms: vomiting        Past Medical History:  Diagnosis Date  . Anxiety   . Bunion   . Gastric ulcer   . GERD (gastroesophageal reflux disease)   . Headache   . Irritable bowel     Patient Active Problem List   Diagnosis Date Noted  . Abdominal cramping 08/14/2017  . Pap smear for cervical cancer screening 08/14/2017  . Generalized anxiety disorder 05/16/2017  . Bunion     Past Surgical History:  Procedure Laterality Date  . FOOT SURGERY       OB History   No obstetric history on file.     Family History  Adopted: Yes    Social History   Tobacco Use  . Smoking status: Former Smoker    Types: Cigarettes  . Smokeless tobacco: Never Used  Substance Use Topics  . Alcohol use: Yes    Comment: 1/month  . Drug use: No    Home Medications Prior to Admission  medications   Medication Sig Start Date End Date Taking? Authorizing Provider  aspirin-acetaminophen-caffeine (EXCEDRIN MIGRAINE) [phone] MG tablet Take 2 tablets by mouth every 6 (six) hours as needed for headache.    [provider]  clonazePAM (KLONOPIN) 0.5 MG tablet Take 1 tablet (0.5 mg total) by mouth at bedtime. Patient not taking: Reported on 09/17/2018 08/14/17   Quentin Angst, MD  doxycycline (VIBRAMYCIN) 100 MG capsule Take 1 capsule (100 mg total) by mouth 2 (two) times daily. One po bid x 7 days Patient not taking: Reported on 09/17/2018 10/04/17   Mancel Bale, MD  meloxicam (MOBIC) 15 MG tablet Take 1 tablet (15 mg total) by mouth daily. Patient not taking: Reported on 09/17/2018 08/14/17   Quentin Angst, MD  methocarbamol (ROBAXIN) 500 MG tablet Take 1 tablet (500 mg total) by mouth 4 (four) times daily. Patient not taking: Reported on 09/17/2018 04/12/17   Loletta Specter, PA-C  omeprazole (PRILOSEC) 40 MG capsule Take 1 capsule (40 mg total) by mouth daily. Patient not taking: Reported on 09/17/2018 04/12/17   Loletta Specter, PA-C  permethrin Verner Mould) 5 % cream Apply to affected area once 12/18/19   Mannie Stabile, PA-C  ranitidine (ZANTAC) 150 MG tablet Take 150 mg by mouth daily as needed for heartburn.    [provider]  sertraline (ZOLOFT)  50 MG tablet Take 1 tablet (50 mg total) by mouth daily. Patient not taking: Reported on 09/17/2018 08/14/17   Quentin Angst, MD  triamcinolone cream (KENALOG) 0.1 % Apply 1 application topically 2 (two) times daily. 12/18/19   Mannie Stabile, PA-C    Allergies    Patient has no known allergies.  Review of Systems   Review of Systems  Gastrointestinal: Positive for abdominal pain and vomiting.  Neurological: Positive for dizziness.  All other systems reviewed and are negative.   Physical Exam Updated Vital Signs BP (!) 155/89 (BP Location: Left Arm)   Pulse 67   Temp 97.8 F  (36.6 C) (Oral)   Resp 17   Ht 5\' 3"  (1.6 m)   Wt 98.9 kg   LMP 03/19/2020   SpO2 100%   BMI 38.62 kg/m   Physical Exam Vitals and nursing note reviewed.  Constitutional:      General: She is not in acute distress.    Appearance: She is well-developed.  HENT:     Head: Normocephalic and atraumatic.  Eyes:     Conjunctiva/sclera: Conjunctivae normal.     Pupils: Pupils are equal, round, and reactive to light.  Cardiovascular:     Rate and Rhythm: Normal rate and regular rhythm.     Heart sounds: Normal heart sounds.  Pulmonary:     Effort: Pulmonary effort is normal. No respiratory distress.     Breath sounds: Normal breath sounds.  Abdominal:     General: There is no distension.     Palpations: Abdomen is soft.     Tenderness: There is abdominal tenderness in the epigastric area.  Musculoskeletal:        General: No deformity. Normal range of motion.     Cervical back: Normal range of motion and neck supple.  Skin:    General: Skin is warm and dry.  Neurological:     Mental Status: She is alert and oriented to person, place, and time.     ED Results / Procedures / Treatments   Labs (all labs ordered are listed, but only abnormal results are displayed) Labs Reviewed  COMPREHENSIVE METABOLIC PANEL - Abnormal; Notable for the following components:      Result Value   Potassium 3.2 (*)    All other components within normal limits  CBC - Abnormal; Notable for the following components:   WBC 12.3 (*)    Hemoglobin 10.7 (*)    HCT 35.1 (*)    Platelets 416 (*)    All other components within normal limits  URINALYSIS, ROUTINE W REFLEX MICROSCOPIC - Abnormal; Notable for the following components:   Ketones, ur 5 (*)    All other components within normal limits  LIPASE, BLOOD  I-STAT BETA HCG BLOOD, ED ([facility], [facility], [facility] ONLY)    EKG None  Radiology CT ABDOMEN PELVIS W CONTRAST  Result Date: 03/25/2020 CLINICAL DATA:  Abdominal pain, vomiting, and distension. Patient  reports lump that is increasing in size. EXAM: CT ABDOMEN AND PELVIS WITH CONTRAST TECHNIQUE: Multidetector CT imaging of the abdomen and pelvis was performed using the standard protocol following bolus administration of intravenous contrast. CONTRAST:  03/27/2020 OMNIPAQUE IOHEXOL 300 MG/ML  SOLN COMPARISON:  Abdominal CT 08/11/2016 FINDINGS: Lower chest: Lung bases are clear. Hepatobiliary: No focal liver abnormality is seen. Liver is prominent size spanning 18 cm cranial caudal. No gallstones, gallbladder wall thickening, or biliary dilatation. Pancreas: No ductal dilatation or inflammation. Spleen: Normal in size without focal  abnormality. Adrenals/Urinary Tract: Normal adrenal glands. No hydronephrosis or perinephric edema. Homogeneous renal enhancement with symmetric excretion on delayed phase imaging. 2.8 cm cyst in the upper right kidney. Urinary bladder is physiologically distended without wall thickening. Stomach/Bowel: Stomach is unremarkable. Normal positioning of the ligament of Treitz. No small bowel obstruction or obvious inflammation. Appendix is not well visualized, no evidence of appendicitis. Mild diverticulosis of the splenic flexure of the colon without diverticulitis. Vascular/Lymphatic: Minor aortic atherosclerosis. No aortic aneurysm. No adenopathy. Reproductive: Heterogeneous uterus which may represent underlying fibroids. No adnexal mass. Other: Ventral upper abdominal wall hernia contains fat, spanning approximately 3.6 cm transverse. Hernia neck measures approximately 9 mm. There is no bowel involvement or stranding/inflammatory change. No ascites. No free air. Musculoskeletal: Imaging vision a lumbosacral anatomy. There are no acute or suspicious osseous abnormalities. IMPRESSION: 1. Ventral upper abdominal wall hernia contains fat. No bowel involvement or inflammation. 2. Mild colonic diverticulosis without diverticulitis. Aortic Atherosclerosis (ICD10-I70.0). Electronically Signed   By:  Keith Rake M.D.   On: 03/25/2020 21:35    Procedures Procedures (including critical care time)  Medications Ordered in ED Medications  sodium chloride flush (NS) 0.9 % injection 3 mL (has no administration in time range)  ondansetron (ZOFRAN) tablet 4 mg (has no administration in time range)    ED Course  I have reviewed the triage vital signs and the nursing notes.  Pertinent labs & imaging results that were available during my care of the patient were reviewed by me and considered in my medical decision making (see chart for details).    MDM Rules/Calculators/A&P                      MDM  Screen complete  Jarae Defibaugh was evaluated in Emergency Department on 03/25/2020 for the symptoms described in the history of present illness. She was evaluated in the context of the global COVID-19 pandemic, which necessitated consideration that the patient might be at risk for infection with the SARS-CoV-2 virus that causes COVID-19. Institutional protocols and algorithms that pertain to the evaluation of patients at risk for COVID-19 are in a state of rapid change based on information released by regulatory bodies including the CDC and federal and state organizations. These policies and algorithms were followed during the patient's care in the ED.   Patient is presenting for evaluation of mid abdominal discomfort.  Patient's exam is suggestive of likely ventral abdominal hernia.  CT demonstrates presence of ventral abdominal hernia without incarceration of bowel.  Patient is comfortable at time of discharge.  She does understand the need for close follow-up with surgery.  She is aware of the warning symptoms for incarceration.  Strict return precautions given and understood.  Importance of close follow-up is stressed.    Final Clinical Impression(s) / ED Diagnoses Final diagnoses:  Ventral hernia without obstruction or gangrene    Rx / DC Orders ED Discharge Orders    None        Valarie Merino, MD 03/25/20 2203

## 2020-03-25 NOTE — Discharge Instructions (Signed)
Please return for any problem.  Follow-up with your regular care provider as instructed.  Follow-up with [facility] surgery for further evaluation of your ventral abdominal hernia.

## 2020-03-25 NOTE — ED Notes (Signed)
Pt up walking around the room talking on the phone. Pt denies any needs except meds. Will continue to monitor.

## 2020-04-19 ENCOUNTER — Other Ambulatory Visit: Payer: Self-pay | Admitting: Surgery

## 2020-06-27 ENCOUNTER — Other Ambulatory Visit: Payer: Self-pay

## 2020-06-27 ENCOUNTER — Encounter (HOSPITAL_BASED_OUTPATIENT_CLINIC_OR_DEPARTMENT_OTHER): Payer: Self-pay | Admitting: Surgery

## 2020-07-01 ENCOUNTER — Other Ambulatory Visit (HOSPITAL_COMMUNITY)
Admission: RE | Admit: 2020-07-01 | Discharge: 2020-07-01 | Disposition: A | Discharge status: Home / Self Care | Payer: 59 | Source: Ambulatory Visit | Attending: Surgery | Admitting: Surgery

## 2020-07-01 DIAGNOSIS — Z20822 Contact with and (suspected) exposure to covid-19: Secondary | ICD-10-CM | POA: Insufficient documentation

## 2020-07-01 DIAGNOSIS — Z01812 Encounter for preprocedural laboratory examination: Secondary | ICD-10-CM | POA: Insufficient documentation

## 2020-07-01 LAB — SARS CORONAVIRUS 2 (TAT 6-24 HRS): SARS Coronavirus 2: NEGATIVE

## 2020-07-01 MED ORDER — ENSURE PRE-SURGERY PO LIQD: 296.0000 mL | Freq: Once | ORAL | Status: DC

## 2020-07-01 NOTE — Progress Notes (Signed)

## 2020-07-03 NOTE — H&P (Signed)
Rachael Moyer  Location: [facility] Patient #: [id number] DOB: 08/23/1976 Married / Language: Undefined / Race: Refused to Report/Unreported Female   History of Present Illness () The patient is a 44 year old female who presents with an abdominal wall hernia.  Chief complaint: Ventral hernia  This is a pleasant 44 year old female accompanied by her husband who presents with a ventral hernia. She has noticed a bulge in her upper abdomen for several years. It has slowly been enlarging and more painful. She reports it hurts daily especially with activity. She has had some nausea and vomiting on occasion. She also has heartburn and constipation. She underwent a CAT scan of her abdomen and pelvis recently showing a ventral hernia. It contained almost 4 cm of fat and the fascial defect was 9 mm. There was no bowel involved with the hernia. The CT scan was otherwise unremarkable. She has had no previous surgery on her abdomen. She is otherwise healthy without complaints.   Past Surgical History  Colon Polyp Removal - Colonoscopy  Foot Surgery  Right.  Diagnostic Studies History  Mammogram  never Pap Smear  1-5 years ago   No Known Drug Allergies    Medication History (Chemira Jones, CMA; ) No Current Medications Medications Reconciled  Social History Doristine Devoid, CMA;  Alcohol use  Recently quit alcohol use. Caffeine use  Carbonated beverages, Coffee, Tea. Tobacco use  Former smoker.  Family History Doristine Devoid, CMA; Alcohol Abuse  Brother. Depression  Brother, Mother, Sister. Diabetes Mellitus  Sister. Migraine Headache  Mother, Sister.  Pregnancy / Birth History Doristine Devoid, CMA;  Age at menarche  15 years. Gravida  2 Irregular periods  Length (months) of breastfeeding  3-6 Maternal age  58-25 Para  2  Other Problems Doristine Devoid, CMA; Anxiety Disorder  Back Pain  Depression  Gastric Ulcer   Gastroesophageal Reflux Disease  Hypercholesterolemia  Migraine Headache     Review of Systems (Chemira Jones CMA; General Present- Appetite Loss, Fatigue and Weight Gain. Not Present- Chills, Fever, Night Sweats and Weight Loss. Skin Not Present- Change in Wart/Mole, Dryness, Hives, Jaundice, New Lesions, Non-Healing Wounds, Rash and Ulcer. HEENT Present- Visual Disturbances and Wears glasses/contact lenses. Not Present- Earache, Hearing Loss, Hoarseness, Nose Bleed, Oral Ulcers, Ringing in the Ears, Seasonal Allergies, Sinus Pain, Sore Throat and Yellow Eyes. Respiratory Not Present- Bloody sputum, Chronic Cough, Difficulty Breathing, Snoring and Wheezing. Breast Not Present- Breast Mass, Breast Pain, Nipple Discharge and Skin Changes. Cardiovascular Present- Leg Cramps. Not Present- Chest Pain, Difficulty Breathing Lying Down, Palpitations, Rapid Heart Rate, Shortness of Breath and Swelling of Extremities. Gastrointestinal Present- Abdominal Pain, Bloating, Change in Bowel Habits, Constipation, Gets full quickly at meals, Indigestion, Nausea and Vomiting. Not Present- Bloody Stool, Chronic diarrhea, Difficulty Swallowing, Excessive gas, Hemorrhoids and Rectal Pain. Female Genitourinary Present- Frequency and Nocturia. Not Present- Painful Urination, Pelvic Pain and Urgency. Musculoskeletal Present- Back Pain and Muscle Pain. Not Present- Joint Pain, Joint Stiffness, Muscle Weakness and Swelling of Extremities. Neurological Present- Decreased Memory, Headaches, Tingling and Weakness. Not Present- Fainting, Numbness, Seizures, Tremor and Trouble walking. Psychiatric Present- Anxiety and Change in Sleep Pattern. Not Present- Bipolar, Depression, Fearful and Frequent crying. Endocrine Not Present- Cold Intolerance, Excessive Hunger, Hair Changes, Heat Intolerance, Hot flashes and New Diabetes. Hematology Present- Easy Bruising. Not Present- Blood Thinners, Excessive bleeding, Gland problems,  HIV and Persistent Infections.  Vitals Doristine Devoid CMA;   Weight: 218.4 lb Height: 63in Body Surface Area: 2.01 m Body Mass  Index: 38.69 kg/m  Temp.: 97.24F  Pulse: 103 (Regular)  BP: 130/82(Sitting, Left Arm, Standard)     Physical Exam  The physical exam findings are as follows: Note: She appears well on exam.  There is a chronically incarcerated hernia in her midline above the umbilicus which is tender. The rest of her abdomen is soft and nontender. There is no umbilical hernia.    Assessment & Plan   VENTRAL HERNIA WITHOUT OBSTRUCTION OR GANGRENE (K43.9)  Impression: This is a patient with a chronically incarcerated ventral hernia. I have reviewed her CT scan of the abdomen and pelvis. Again the fascial defect is small but the hernia itself contains a large amount of incarcerated omentum. There are no other abnormalities on the CT scan. I discussed the diagnosis of hernias with the patient and her husband. As she is symptomatic, repair of the hernias recommended. This can be accomplished with a small open hernia repair with mesh as an outpatient. I discussed the surgical procedure with her in detail. I discussed the risks which includes but is not limited to bleeding, infection, injury to surrounding structures, the chance this may not resolve all of her symptoms, use of mesh, hernia recurrence, cardiopulmonary issues, postoperative recovery, etc. She understands and wishes to proceed with surgery which will be scheduled.

## 2020-07-04 ENCOUNTER — Ambulatory Visit (HOSPITAL_BASED_OUTPATIENT_CLINIC_OR_DEPARTMENT_OTHER): Payer: 59 | Admitting: Anesthesiology

## 2020-07-04 ENCOUNTER — Ambulatory Visit (HOSPITAL_BASED_OUTPATIENT_CLINIC_OR_DEPARTMENT_OTHER)
Admission: RE | Admit: 2020-07-04 | Discharge: 2020-07-04 | Disposition: A | Discharge status: Home / Self Care | Payer: 59 | Attending: Surgery | Admitting: Surgery

## 2020-07-04 ENCOUNTER — Encounter (HOSPITAL_BASED_OUTPATIENT_CLINIC_OR_DEPARTMENT_OTHER)
Admission: RE | Disposition: A | Discharge status: Home / Self Care | Payer: Self-pay | Source: Home / Self Care | Attending: Surgery

## 2020-07-04 ENCOUNTER — Other Ambulatory Visit: Payer: Self-pay

## 2020-07-04 ENCOUNTER — Encounter (HOSPITAL_BASED_OUTPATIENT_CLINIC_OR_DEPARTMENT_OTHER): Payer: Self-pay | Admitting: Surgery

## 2020-07-04 DIAGNOSIS — K219 Gastro-esophageal reflux disease without esophagitis: Secondary | ICD-10-CM | POA: Insufficient documentation

## 2020-07-04 DIAGNOSIS — Z87891 Personal history of nicotine dependence: Secondary | ICD-10-CM | POA: Insufficient documentation

## 2020-07-04 DIAGNOSIS — K436 Other and unspecified ventral hernia with obstruction, without gangrene: Secondary | ICD-10-CM | POA: Diagnosis present

## 2020-07-04 HISTORY — PX: VENTRAL HERNIA REPAIR: SHX424

## 2020-07-04 LAB — POCT PREGNANCY, URINE: Preg Test, Ur: NEGATIVE

## 2020-07-04 SURGERY — REPAIR, HERNIA, VENTRAL
Anesthesia: General | Site: Abdomen

## 2020-07-04 MED ORDER — CHLORHEXIDINE GLUCONATE CLOTH 2 % EX PADS: 6.0000 | MEDICATED_PAD | Freq: Once | CUTANEOUS | Status: DC

## 2020-07-04 MED ORDER — MIDAZOLAM HCL 5 MG/5ML IJ SOLN
INTRAMUSCULAR | Status: DC | PRN
  Administered 2020-07-04: 2 mg via INTRAVENOUS

## 2020-07-04 MED ORDER — ACETAMINOPHEN 500 MG PO TABS
ORAL_TABLET | ORAL | Status: AC
  Filled 2020-07-04: qty 2

## 2020-07-04 MED ORDER — DIPHENHYDRAMINE HCL 50 MG/ML IJ SOLN
6.2500 mg | Freq: Once | INTRAMUSCULAR | Status: AC
  Administered 2020-07-04: 6.5 mg via INTRAVENOUS

## 2020-07-04 MED ORDER — PHENYLEPHRINE 40 MCG/ML (10ML) SYRINGE FOR IV PUSH (FOR BLOOD PRESSURE SUPPORT)
PREFILLED_SYRINGE | INTRAVENOUS | Status: AC
  Filled 2020-07-04: qty 10

## 2020-07-04 MED ORDER — LACTATED RINGERS IV SOLN: INTRAVENOUS | Status: DC

## 2020-07-04 MED ORDER — SCOPOLAMINE 1 MG/3DAYS TD PT72
1.0000 | MEDICATED_PATCH | TRANSDERMAL | Status: DC
  Administered 2020-07-04: 1.5 mg via TRANSDERMAL

## 2020-07-04 MED ORDER — FENTANYL CITRATE (PF) 100 MCG/2ML IJ SOLN
INTRAMUSCULAR | Status: AC
  Filled 2020-07-04: qty 2

## 2020-07-04 MED ORDER — PROPOFOL 10 MG/ML IV BOLUS
INTRAVENOUS | Status: DC | PRN
  Administered 2020-07-04: 200 mg via INTRAVENOUS

## 2020-07-04 MED ORDER — LIDOCAINE 2% (20 MG/ML) 5 ML SYRINGE
INTRAMUSCULAR | Status: AC
  Filled 2020-07-04: qty 5

## 2020-07-04 MED ORDER — ONDANSETRON HCL 4 MG/2ML IJ SOLN
INTRAMUSCULAR | Status: AC
  Filled 2020-07-04: qty 2

## 2020-07-04 MED ORDER — OXYCODONE-ACETAMINOPHEN 5-325 MG PO TABS: 1.0000 | ORAL_TABLET | Freq: Four times a day (QID) | ORAL | 0 refills | Status: AC | PRN

## 2020-07-04 MED ORDER — DIPHENHYDRAMINE HCL 50 MG/ML IJ SOLN
INTRAMUSCULAR | Status: AC
  Filled 2020-07-04: qty 1

## 2020-07-04 MED ORDER — OXYCODONE HCL 5 MG PO TABS: 5.0000 mg | ORAL_TABLET | Freq: Once | ORAL | Status: DC | PRN

## 2020-07-04 MED ORDER — PROPOFOL 500 MG/50ML IV EMUL
INTRAVENOUS | Status: AC
  Filled 2020-07-04: qty 50

## 2020-07-04 MED ORDER — CEFAZOLIN SODIUM-DEXTROSE 2-4 GM/100ML-% IV SOLN
2.0000 g | INTRAVENOUS | Status: AC
  Administered 2020-07-04: 2 g via INTRAVENOUS

## 2020-07-04 MED ORDER — CELECOXIB 200 MG PO CAPS
ORAL_CAPSULE | ORAL | Status: AC
  Filled 2020-07-04: qty 2

## 2020-07-04 MED ORDER — DEXAMETHASONE SODIUM PHOSPHATE 4 MG/ML IJ SOLN
INTRAMUSCULAR | Status: DC | PRN
  Administered 2020-07-04: 10 mg via INTRAVENOUS

## 2020-07-04 MED ORDER — ACETAMINOPHEN 500 MG PO TABS
1000.0000 mg | ORAL_TABLET | ORAL | Status: AC
  Administered 2020-07-04: 1000 mg via ORAL

## 2020-07-04 MED ORDER — PROMETHAZINE HCL 25 MG/ML IJ SOLN: 6.2500 mg | INTRAMUSCULAR | Status: DC | PRN

## 2020-07-04 MED ORDER — EPHEDRINE 5 MG/ML INJ
INTRAVENOUS | Status: AC
  Filled 2020-07-04: qty 10

## 2020-07-04 MED ORDER — FENTANYL CITRATE (PF) 100 MCG/2ML IJ SOLN
INTRAMUSCULAR | Status: DC | PRN
Start: 2020-07-04 — End: 2020-07-04
  Administered 2020-07-04: 50 ug via INTRAVENOUS
  Administered 2020-07-04: 100 ug via INTRAVENOUS
  Administered 2020-07-04: 50 ug via INTRAVENOUS

## 2020-07-04 MED ORDER — SCOPOLAMINE 1 MG/3DAYS TD PT72
MEDICATED_PATCH | TRANSDERMAL | Status: AC
  Filled 2020-07-04: qty 1

## 2020-07-04 MED ORDER — LIDOCAINE HCL (CARDIAC) PF 100 MG/5ML IV SOSY
PREFILLED_SYRINGE | INTRAVENOUS | Status: DC | PRN
  Administered 2020-07-04: 60 mg via INTRAVENOUS

## 2020-07-04 MED ORDER — GABAPENTIN 300 MG PO CAPS
300.0000 mg | ORAL_CAPSULE | ORAL | Status: AC
  Administered 2020-07-04: 300 mg via ORAL

## 2020-07-04 MED ORDER — GABAPENTIN 300 MG PO CAPS
ORAL_CAPSULE | ORAL | Status: AC
  Filled 2020-07-04: qty 1

## 2020-07-04 MED ORDER — ONDANSETRON HCL 4 MG/2ML IJ SOLN
INTRAMUSCULAR | Status: DC | PRN
  Administered 2020-07-04: 4 mg via INTRAVENOUS

## 2020-07-04 MED ORDER — DIPHENHYDRAMINE HCL 50 MG/ML IJ SOLN
INTRAMUSCULAR | Status: DC | PRN
  Administered 2020-07-04: 6.25 mg via INTRAVENOUS

## 2020-07-04 MED ORDER — MIDAZOLAM HCL 2 MG/2ML IJ SOLN
INTRAMUSCULAR | Status: AC
  Filled 2020-07-04: qty 2

## 2020-07-04 MED ORDER — OXYCODONE HCL 5 MG/5ML PO SOLN: 5.0000 mg | Freq: Once | ORAL | Status: DC | PRN

## 2020-07-04 MED ORDER — BUPIVACAINE HCL (PF) 0.5 % IJ SOLN
INTRAMUSCULAR | Status: DC | PRN
  Administered 2020-07-04: 20 mL

## 2020-07-04 MED ORDER — DEXAMETHASONE SODIUM PHOSPHATE 10 MG/ML IJ SOLN
INTRAMUSCULAR | Status: AC
  Filled 2020-07-04: qty 1

## 2020-07-04 MED ORDER — CELECOXIB 200 MG PO CAPS
400.0000 mg | ORAL_CAPSULE | ORAL | Status: AC
  Administered 2020-07-04: 400 mg via ORAL

## 2020-07-04 MED ORDER — CEFAZOLIN SODIUM-DEXTROSE 2-4 GM/100ML-% IV SOLN
INTRAVENOUS | Status: AC
  Filled 2020-07-04: qty 100

## 2020-07-04 MED ORDER — SUCCINYLCHOLINE CHLORIDE 200 MG/10ML IV SOSY
PREFILLED_SYRINGE | INTRAVENOUS | Status: AC
  Filled 2020-07-04: qty 10

## 2020-07-04 MED ORDER — FENTANYL CITRATE (PF) 100 MCG/2ML IJ SOLN
25.0000 ug | INTRAMUSCULAR | Status: DC | PRN
  Administered 2020-07-04 (×2): 50 ug via INTRAVENOUS

## 2020-07-04 SURGICAL SUPPLY — 62 items
ADH SKN CLS APL DERMABOND .7 (GAUZE/BANDAGES/DRESSINGS) ×1
APL PRP STRL LF DISP 70% ISPRP (MISCELLANEOUS) ×1
BLADE CLIPPER SURG (BLADE) IMPLANT
BLADE SURG 10 STRL SS (BLADE) IMPLANT
BLADE SURG 15 STRL LF DISP TIS (BLADE) ×1 IMPLANT
BLADE SURG 15 STRL SS (BLADE) ×3
CANISTER SUCT 1200ML W/VALVE (MISCELLANEOUS) IMPLANT
CHLORAPREP W/TINT 26 (MISCELLANEOUS) ×3 IMPLANT
CLEANER CAUTERY TIP 5X5 PAD (MISCELLANEOUS) IMPLANT
COVER BACK TABLE 60X90IN (DRAPES) ×3 IMPLANT
COVER MAYO STAND STRL (DRAPES) ×3 IMPLANT
COVER WAND RF STERILE (DRAPES) IMPLANT
DECANTER SPIKE VIAL GLASS SM (MISCELLANEOUS) ×3 IMPLANT
DERMABOND ADVANCED (GAUZE/BANDAGES/DRESSINGS) ×2
DERMABOND ADVANCED .7 DNX12 (GAUZE/BANDAGES/DRESSINGS) ×1 IMPLANT
DRAPE LAPAROTOMY 100X72 PEDS (DRAPES) ×3 IMPLANT
DRAPE UTILITY XL STRL (DRAPES) ×3 IMPLANT
DRSG TEGADERM 2-3/8X2-3/4 SM (GAUZE/BANDAGES/DRESSINGS) IMPLANT
ELECT REM PT RETURN 9FT ADLT (ELECTROSURGICAL) ×3
ELECTRODE REM PT RTRN 9FT ADLT (ELECTROSURGICAL) ×1 IMPLANT
GAUZE SPONGE 4X4 12PLY STRL (GAUZE/BANDAGES/DRESSINGS) IMPLANT
GLOVE BIO SURGEON STRL SZ 6.5 (GLOVE) ×4 IMPLANT
GLOVE BIO SURGEONS STRL SZ 6.5 (GLOVE) ×2
GLOVE BIOGEL PI IND STRL 6.5 (GLOVE) ×1 IMPLANT
GLOVE BIOGEL PI IND STRL 7.0 (GLOVE) ×1 IMPLANT
GLOVE BIOGEL PI INDICATOR 6.5 (GLOVE) ×2
GLOVE BIOGEL PI INDICATOR 7.0 (GLOVE) ×2
GLOVE SURG SIGNA 7.5 PF LTX (GLOVE) ×3 IMPLANT
GOWN STRL REUS W/ TWL LRG LVL3 (GOWN DISPOSABLE) ×2 IMPLANT
GOWN STRL REUS W/ TWL XL LVL3 (GOWN DISPOSABLE) ×1 IMPLANT
GOWN STRL REUS W/TWL LRG LVL3 (GOWN DISPOSABLE) ×6
GOWN STRL REUS W/TWL XL LVL3 (GOWN DISPOSABLE) ×3
MESH VENTRALEX ST 1-7/10 CRC S (Mesh General) ×3 IMPLANT
NEEDLE HYPO 25X1 1.5 SAFETY (NEEDLE) ×3 IMPLANT
NS IRRIG 1000ML POUR BTL (IV SOLUTION) IMPLANT
PACK BASIN DAY SURGERY FS (CUSTOM PROCEDURE TRAY) ×3 IMPLANT
PAD CLEANER CAUTERY TIP 5X5 (MISCELLANEOUS)
PENCIL SMOKE EVACUATOR (MISCELLANEOUS) ×3 IMPLANT
SLEEVE SCD COMPRESS KNEE MED (MISCELLANEOUS) ×3 IMPLANT
SPONGE LAP 4X18 RFD (DISPOSABLE) ×3 IMPLANT
STAPLER VISISTAT 35W (STAPLE) IMPLANT
SUT ETHIBOND 0 MO6 C/R (SUTURE) IMPLANT
SUT MNCRL AB 4-0 PS2 18 (SUTURE) ×3 IMPLANT
SUT NOVA NAB DX-16 0-1 5-0 T12 (SUTURE) ×3 IMPLANT
SUT NOVA NAB GS-21 1 T12 (SUTURE) IMPLANT
SUT VIC AB 2-0 SH 27 (SUTURE)
SUT VIC AB 2-0 SH 27XBRD (SUTURE) IMPLANT
SUT VIC AB 3-0 FS2 27 (SUTURE) IMPLANT
SUT VIC AB 3-0 SH 27 (SUTURE) ×3
SUT VIC AB 3-0 SH 27X BRD (SUTURE) ×1 IMPLANT
SUT VIC AB 4-0 RB1 27 (SUTURE)
SUT VIC AB 4-0 RB1 27X BRD (SUTURE) IMPLANT
SUT VIC AB 5-0 PS2 18 (SUTURE) IMPLANT
SUT VICRYL 4-0 PS2 18IN ABS (SUTURE) IMPLANT
SUT VICRYL AB 2 0 TIE (SUTURE) IMPLANT
SUT VICRYL AB 2 0 TIES (SUTURE)
SYR BULB EAR ULCER 3OZ GRN STR (SYRINGE) IMPLANT
SYR CONTROL 10ML LL (SYRINGE) ×3 IMPLANT
TOWEL GREEN STERILE FF (TOWEL DISPOSABLE) ×3 IMPLANT
TUBE CONNECTING 20'X1/4 (TUBING)
TUBE CONNECTING 20X1/4 (TUBING) IMPLANT
YANKAUER SUCT BULB TIP NO VENT (SUCTIONS) IMPLANT

## 2020-07-04 NOTE — Op Note (Signed)
VENTRAL HERNIA REPAIR WITH MESH  Procedure Note  Rachael Moyer 07/04/2020   Pre-op Diagnosis: VENTRAL HERNIA     Post-op Diagnosis: same  Procedure(s): VENTRAL HERNIA REPAIR WITH MESH  Surgeon(s): Abigail Miyamoto, MD Hedda Slade, PA-C  Anesthesia: General  Staff:  Circulator: Raliegh Scarlet, RN Scrub Person: Wardell Heath, CST  Estimated Blood Loss: Minimal               Findings: The patient was found to have a 1.0 cm fascial defect several inches above the umbilicus containing chronically incarcerated omentum.  It was repaired with a 4.3 cm round Prolene ventral patch from [facility]  Procedure: The patient was brought to the operating room and identifies correct patient.  She is placed upon the operating table general anesthesia was induced.  Her abdomen was prepped and draped in usual sterile fashion.  I anesthetized skin over lying the palpable hernia with Marcaine.  I made incision with a scalpel.  I then dissected down through the subcutaneous tissue to the hernia.  There was a moderate sized hernia sac.  The sac contained omentum.  I excised the sac at the base as it exited the fascia and excised the omentum as well with the cautery.  The fascial defect was approximately 1.0 cm in size.  Next, a 4.3 cm round ventral Prolene patch from [facility] was brought onto the field.  It was placed through the fascia opening and then pulled up against the peritoneum with stay ties.  I sutured the mesh in place circumferentially with #1 Novafil sutures.  I then cut the stay ties and closed the fascia over the top of the mesh with a figure-of-eight #1 Novafil suture.  Wide coverage of the fascia appeared to be achieved.  I anesthetized the fascia further with Marcaine.  Hemostasis was achieved.  I then closed subtenons tissue with interrupted 3-0 Vicryl sutures and the skin was closed with running 4-0 Monocryl.  Dermabond was then applied.  The patient tolerated the procedure well.  All the counts  were correct at the end of the procedure.  The patient was then extubated in the operating room and taken in stable addition to the recovery room.          Abigail Miyamoto   Date: 07/04/2020  Time: 10:57 AM

## 2020-07-04 NOTE — Anesthesia Preprocedure Evaluation (Addendum)
Anesthesia Evaluation  Patient identified by MRN, date of birth, ID band Patient awake    Reviewed: Allergy & Precautions, NPO status , Patient's Chart, lab work & pertinent test results  Airway Mallampati: III  TM Distance: >3 FB Neck ROM: Full    Dental no notable dental hx.    Pulmonary former smoker,    Pulmonary exam normal breath sounds clear to auscultation       Cardiovascular negative cardio ROS Normal cardiovascular exam Rhythm:Regular Rate:Normal     Neuro/Psych  Headaches, PSYCHIATRIC DISORDERS Anxiety    GI/Hepatic Neg liver ROS, PUD, GERD  Medicated and Controlled,IBS   Endo/Other  negative endocrine ROS  Renal/GU negative Renal ROS     Musculoskeletal negative musculoskeletal ROS (+)   Abdominal (+) + obese,   Peds  Hematology negative hematology ROS (+)   Anesthesia Other Findings VENTRAL HERNIA  Reproductive/Obstetrics                            Anesthesia Physical Anesthesia Plan  ASA: II  Anesthesia Plan: General   Post-op Pain Management:    Induction: Intravenous  PONV Risk Score and Plan: 4 or greater and Ondansetron, Dexamethasone, Midazolam and Scopolamine patch - Pre-op  Airway Management Planned: LMA  Additional Equipment:   Intra-op Plan:   Post-operative Plan: Extubation in OR  Informed Consent: I have reviewed the patients History and Physical, chart, labs and discussed the procedure including the risks, benefits and alternatives for the proposed anesthesia with the patient or authorized representative who has indicated his/her understanding and acceptance.     Dental advisory given  Plan Discussed with: CRNA  Anesthesia Plan Comments:        Anesthesia Quick Evaluation

## 2020-07-04 NOTE — Interval H&P Note (Signed)
History and Physical Interval Note: no change in H and P  07/04/2020 10:05 AM  Rachael Moyer  has presented today for surgery, with the diagnosis of VENTRAL HERNIA.  The various methods of treatment have been discussed with the patient and family. After consideration of risks, benefits and other options for treatment, the patient has consented to  Procedure(s) with comments: VENTRAL HERNIA REPAIR WITH MESH (N/A) - LMA as a surgical intervention.  The patient's history has been reviewed, patient examined, no change in status, stable for surgery.  I have reviewed the patient's chart and labs.  Questions were answered to the patient's satisfaction.     Abigail Miyamoto

## 2020-07-04 NOTE — Anesthesia Postprocedure Evaluation (Signed)
Anesthesia Post Note  Patient: Rachael Moyer  Procedure(s) Performed: VENTRAL HERNIA REPAIR WITH MESH (N/A Abdomen)     Patient location during evaluation: PACU Anesthesia Type: General Level of consciousness: awake and alert Pain management: pain level controlled Vital Signs Assessment: post-procedure vital signs reviewed and stable Respiratory status: spontaneous breathing, nonlabored ventilation, respiratory function stable and patient connected to nasal cannula oxygen Cardiovascular status: blood pressure returned to baseline and stable Postop Assessment: no apparent nausea or vomiting Anesthetic complications: no   No complications documented.  Last Vitals:  Vitals:   07/04/20 1212 07/04/20 1245  BP: 130/82 120/81  Pulse: 63 60  Resp: 13 14  Temp:  (!) 36.4 C  SpO2: 100% 100%    Last Pain:  Vitals:   07/04/20 1200  TempSrc:   PainSc: Asleep                 Fayelynn Distel P Ricketta Colantonio

## 2020-07-04 NOTE — Discharge Instructions (Signed)
[facility] _______[facility]  UMBILICAL OR INGUINAL HERNIA REPAIR: POST OP INSTRUCTIONS  Always review your discharge instruction sheet given to you by the facility where your surgery was performed. IF YOU HAVE DISABILITY OR FAMILY LEAVE FORMS, YOU MUST BRING THEM TO THE OFFICE FOR PROCESSING.   DO NOT GIVE THEM TO YOUR DOCTOR.  1. A  prescription for pain medication may be given to you upon discharge.  Take your pain medication as prescribed, if needed.  If narcotic pain medicine is not needed, then you may take acetaminophen (Tylenol) or ibuprofen (Advil) as needed. 2. Take your usually prescribed medications unless otherwise directed. If you need a refill on your pain medication, please contact your pharmacy.  They will contact our office to request authorization. Prescriptions will not be filled after 5 pm or on week-ends. 3. You should follow a light diet the first 24 hours after arrival home, such as soup and crackers, etc.  Be sure to include lots of fluids daily.  Resume your normal diet the day after surgery. 4.Most patients will experience some swelling and bruising around the umbilicus or in the groin and scrotum.  Ice packs and reclining will help.  Swelling and bruising can take several days to resolve.  6. It is common to experience some constipation if taking pain medication after surgery.  Increasing fluid intake and taking a stool softener (such as Colace) will usually help or prevent this problem from occurring.  A mild laxative (Milk of Magnesia or Miralax) should be taken according to package directions if there are no bowel movements after 48 hours. 7. Unless discharge instructions indicate otherwise, you may remove your bandages 24-48 hours after surgery, and you may shower at that time.  You may have steri-strips (small skin tapes) in place directly over the incision.  These strips should be left on the skin for 7-10 days.  If your surgeon used skin glue on the  incision, you may shower in 24 hours.  The glue will flake off over the next 2-3 weeks.  Any sutures or staples will be removed at the office during your follow-up visit. 8. ACTIVITIES:  You may resume regular (light) daily activities beginning the next day--such as daily self-care, walking, climbing stairs--gradually increasing activities as tolerated.  You may have sexual intercourse when it is comfortable.  Refrain from any heavy lifting or straining until approved by your doctor.  a.You may drive when you are no longer taking prescription pain medication, you can comfortably wear a seatbelt, and you can safely maneuver your car and apply brakes. b.RETURN TO WORK:   _____________________________________________  9.You should see your doctor in the office for a follow-up appointment approximately 2-3 weeks after your surgery.  Make sure that you call for this appointment within a day or two after you arrive home to insure a convenient appointment time. 10.OTHER INSTRUCTIONS: _OK TO SHOWER STARTING TOMORROW ICE PACK, TYLENOL, AND IBUPROFEN ALSO FOR PAIN NO LIFTING MORE THAN 15 TO 20 POUNDS FOR 4 WEEKS________________________    _____________________________________  WHEN TO CALL YOUR DOCTOR: 1. Fever over 101.0 2. Inability to urinate 3. Nausea and/or vomiting 4. Extreme swelling or bruising 5. Continued bleeding from incision. 6. Increased pain, redness, or drainage from the incision  The clinic staff is available to answer your questions during regular business hours.  Please don't hesitate to call and ask to speak to one of the nurses for clinical concerns.  If you have a medical emergency, go to   the nearest emergency room or call 911.  A surgeon from [facility] is always on call at the hospital   [address], [address], [place], [place]  [address] ?  [address] [address], [place], [place]   [address] [phone] ? [phone] ? FAX [phone] Web site:  [email or web address]   Post Anesthesia Home Care Instructions  Activity: Get plenty of rest for the remainder of the day. A responsible individual must stay with you for 24 hours following the procedure.  For the next 24 hours, DO NOT: -Drive a car -Advertising copywriter -Drink alcoholic beverages -Take any medication unless instructed by your physician -Make any legal decisions or sign important papers.  Meals: Start with liquid foods such as gelatin or soup. Progress to regular foods as tolerated. Avoid greasy, spicy, heavy foods. If nausea and/or vomiting occur, drink only clear liquids until the nausea and/or vomiting subsides. Call your physician if vomiting continues.  Special Instructions/Symptoms: Your throat may feel dry or sore from the anesthesia or the breathing tube placed in your throat during surgery. If this causes discomfort, gargle with warm salt water. The discomfort should disappear within 24 hours.  If you had a scopolamine patch placed behind your ear for the management of post- operative nausea and/or vomiting:  1. The medication in the patch is effective for 72 hours, after which it should be removed.  Wrap patch in a tissue and discard in the trash. Wash hands thoroughly with soap and water. 2. You may remove the patch earlier than 72 hours if you experience unpleasant side effects which may include dry mouth, dizziness or visual disturbances. 3. Avoid touching the patch. Wash your hands with soap and water after contact with the patch.      NO TYLENOL PRODUCTS UNTIL 4 :00 PM  NO MOTRIN/ ADVIL UNTIL 6:00 PM IF OK WITH DR Magnus Ivan

## 2020-07-04 NOTE — Transfer of Care (Signed)
Immediate Anesthesia Transfer of Care Note  Patient: Rachael Moyer  Procedure(s) Performed: VENTRAL HERNIA REPAIR WITH MESH (N/A Abdomen)  Patient Location: PACU  Anesthesia Type:General  Level of Consciousness: awake, drowsy and patient cooperative  Airway & Oxygen Therapy: Patient Spontanous Breathing and Patient connected to face mask oxygen  Post-op Assessment: Report given to RN and Post -op Vital signs reviewed and stable  Post vital signs: Reviewed and stable  Last Vitals:  Vitals Value Taken Time  BP 138/78 07/04/20 1107  Temp    Pulse 98 07/04/20 1108  Resp 20 07/04/20 1108  SpO2 93 % 07/04/20 1108  Vitals shown include unvalidated device data.  Last Pain:  Vitals:   07/04/20 0952  TempSrc: Oral  PainSc: 9          Complications: No complications documented.

## 2020-07-04 NOTE — Anesthesia Procedure Notes (Signed)
Procedure Name: LMA Insertion Date/Time: 07/04/2020 10:29 AM Performed by: Ronnette Hila, CRNA Pre-anesthesia Checklist: Patient identified, Emergency Drugs available, Suction available and Patient being monitored Patient Re-evaluated:Patient Re-evaluated prior to induction Oxygen Delivery Method: Circle system utilized Preoxygenation: Pre-oxygenation with 100% oxygen Induction Type: IV induction Ventilation: Mask ventilation without difficulty LMA: LMA inserted LMA Size: 5.0 Number of attempts: 1 Airway Equipment and Method: Bite block Placement Confirmation: positive ETCO2 Tube secured with: Tape Dental Injury: Teeth and Oropharynx as per pre-operative assessment

## 2020-07-05 ENCOUNTER — Encounter (HOSPITAL_BASED_OUTPATIENT_CLINIC_OR_DEPARTMENT_OTHER): Payer: Self-pay | Admitting: Surgery

## 2020-07-06 ENCOUNTER — Emergency Department (HOSPITAL_COMMUNITY): Payer: 59

## 2020-07-06 ENCOUNTER — Emergency Department (HOSPITAL_COMMUNITY)
Admission: EM | Admit: 2020-07-06 | Discharge: 2020-07-06 | Disposition: A | Discharge status: Home / Self Care | Payer: 59 | Attending: Emergency Medicine | Admitting: Emergency Medicine

## 2020-07-06 ENCOUNTER — Encounter (HOSPITAL_COMMUNITY): Payer: Self-pay | Admitting: Emergency Medicine

## 2020-07-06 DIAGNOSIS — Z87891 Personal history of nicotine dependence: Secondary | ICD-10-CM | POA: Diagnosis not present

## 2020-07-06 DIAGNOSIS — R071 Chest pain on breathing: Secondary | ICD-10-CM | POA: Insufficient documentation

## 2020-07-06 DIAGNOSIS — G8918 Other acute postprocedural pain: Secondary | ICD-10-CM | POA: Insufficient documentation

## 2020-07-06 DIAGNOSIS — L7682 Other postprocedural complications of skin and subcutaneous tissue: Secondary | ICD-10-CM

## 2020-07-06 DIAGNOSIS — R0600 Dyspnea, unspecified: Secondary | ICD-10-CM | POA: Insufficient documentation

## 2020-07-06 DIAGNOSIS — R109 Unspecified abdominal pain: Secondary | ICD-10-CM | POA: Insufficient documentation

## 2020-07-06 DIAGNOSIS — R0781 Pleurodynia: Secondary | ICD-10-CM

## 2020-07-06 LAB — CBC
HCT: 36.6 % (ref 36.0–46.0)
Hemoglobin: 11.4 g/dL — ABNORMAL LOW (ref 12.0–15.0)
MCH: 28 pg (ref 26.0–34.0)
MCHC: 31.1 g/dL (ref 30.0–36.0)
MCV: 89.9 fL (ref 80.0–100.0)
Platelets: 465 10*3/uL — ABNORMAL HIGH (ref 150–400)
RBC: 4.07 MIL/uL (ref 3.87–5.11)
RDW: 13.7 % (ref 11.5–15.5)
WBC: 11.6 10*3/uL — ABNORMAL HIGH (ref 4.0–10.5)
nRBC: 0 % (ref 0.0–0.2)

## 2020-07-06 LAB — BASIC METABOLIC PANEL
Anion gap: 10 (ref 5–15)
BUN: 7 mg/dL (ref 6–20)
CO2: 24 mmol/L (ref 22–32)
Calcium: 8.8 mg/dL — ABNORMAL LOW (ref 8.9–10.3)
Chloride: 105 mmol/L (ref 98–111)
Creatinine, Ser: 0.8 mg/dL (ref 0.44–1.00)
GFR calc Af Amer: >60 mL/min (ref >60)
GFR calc non Af Amer: >60 mL/min (ref >60)
Glucose, Bld: 96 mg/dL (ref 70–99)
Potassium: 3.6 mmol/L (ref 3.5–5.1)
Sodium: 139 mmol/L (ref 135–145)

## 2020-07-06 LAB — TROPONIN I (HIGH SENSITIVITY)
Troponin I (High Sensitivity): 3 ng/L (ref <18)
Troponin I (High Sensitivity): <2 ng/L (ref <18)

## 2020-07-06 LAB — I-STAT BETA HCG BLOOD, ED (MC, WL, AP ONLY): I-stat hCG, quantitative: <5 m[IU]/mL

## 2020-07-06 MED ORDER — MORPHINE SULFATE (PF) 4 MG/ML IV SOLN
4.0000 mg | Freq: Once | INTRAVENOUS | Status: AC
  Administered 2020-07-06: 4 mg via INTRAVENOUS
  Filled 2020-07-06: qty 1

## 2020-07-06 MED ORDER — ONDANSETRON HCL 4 MG/2ML IJ SOLN
4.0000 mg | Freq: Once | INTRAMUSCULAR | Status: AC
  Administered 2020-07-06: 4 mg via INTRAVENOUS
  Filled 2020-07-06: qty 2

## 2020-07-06 MED ORDER — IOHEXOL 350 MG/ML SOLN
100.0000 mL | Freq: Once | INTRAVENOUS | Status: AC | PRN
  Administered 2020-07-06: 100 mL via INTRAVENOUS

## 2020-07-06 NOTE — ED Provider Notes (Signed)
Emergency Department Provider Note   I have reviewed the triage vital signs and the nursing notes.   HISTORY  Chief Complaint Chest Pain and Post-op Problem   HPI Rachael Moyer is a 44 y.o. female with PMH reviewed below presents to the ED with sharp chest pain since ventral hernia repair currently POD 2.  Patient had an uncomplicated ventral hernia repair with Dr. Magnus Ivan on 8/23.  She has been recovering at home and having sharp pressure in the left/center of her chest.  The pain is pinpoint and nonradiating.  She has worsening pain with deep breathing and some mild dyspnea.  She is also describing sharp pain near the incision site which is also been constant since the surgery.  She has escalated her Percocet dosing from 1 tablet to 2 tablets after discussing with her surgery team.  She is not experiencing fevers.  There is no redness or drainage from the wound.  No cough, congestion, shortness of breath.  No radiation of symptoms or other modifying factors.   Past Medical History:  Diagnosis Date  . Anxiety   . Bunion   . Gastric ulcer   . GERD (gastroesophageal reflux disease)   . Headache   . Irritable bowel     Patient Active Problem List   Diagnosis Date Noted  . Abdominal cramping 08/14/2017  . Pap smear for cervical cancer screening 08/14/2017  . Generalized anxiety disorder 05/16/2017  . Bunion     Past Surgical History:  Procedure Laterality Date  . FOOT SURGERY    . VENTRAL HERNIA REPAIR N/A 07/04/2020   Procedure: VENTRAL HERNIA REPAIR WITH MESH;  Surgeon: Abigail Miyamoto, MD;  Location: [facility];  Service: General;  Laterality: N/A;  LMA    Allergies Patient has no known allergies.  Family History  Adopted: Yes    Social History Social History   Tobacco Use  . Smoking status: Former Smoker    Types: Cigarettes  . Smokeless tobacco: Never Used  Vaping Use  . Vaping Use: Never used  Substance Use Topics  . Alcohol use:  Yes    Comment: 1/month  . Drug use: No    Review of Systems  Constitutional: No fever/chills Eyes: No visual changes. ENT: No sore throat. Cardiovascular: Positive chest pain. Respiratory: Denies shortness of breath. Gastrointestinal: Positive abdominal pain over surgery incision site.  No nausea, no vomiting.  No diarrhea.  No constipation. Genitourinary: Negative for dysuria. Musculoskeletal: Negative for back pain. Skin: Negative for rash. Neurological: Negative for headaches, focal weakness or numbness.  10-point ROS otherwise negative.  ____________________________________________   PHYSICAL EXAM:  VITAL SIGNS: ED Triage Vitals  Enc Vitals Group     BP 07/06/20 0955 134/85     Pulse Rate 07/06/20 0955 82     Resp 07/06/20 0955 14     Temp 07/06/20 0955 98.2 F (36.8 C)     Temp Source 07/06/20 0955 Oral     SpO2 07/06/20 0955 98 %     Weight 07/06/20 0956 218 lb (98.9 kg)     Height 07/06/20 0956 5\' 3"  (1.6 m)   Constitutional: Alert and oriented. Well appearing and in no acute distress. Eyes: Conjunctivae are normal. Head: Atraumatic. Nose: No congestion/rhinnorhea. Mouth/Throat: Mucous membranes are moist.   Neck: No stridor.   Cardiovascular: Normal rate, regular rhythm. Good peripheral circulation. Grossly normal heart sounds.   Respiratory: Normal respiratory effort.  No retractions. Lungs CTAB. Gastrointestinal: Soft with appropriate tenderness of the  epigastrium.  Patient with a clean, dry incision.  No evidence of wound dehiscence.  No surrounding cellulitis.  no distention.  Musculoskeletal:  No gross deformities of extremities. Neurologic:  Normal speech and language.  Skin:  Skin is warm, dry and intact. No rash noted.   ____________________________________________   LABS (all labs ordered are listed, but only abnormal results are displayed)  Labs Reviewed  BASIC METABOLIC PANEL - Abnormal; Notable for the following components:      Result  Value   Calcium 8.8 (*)    All other components within normal limits  CBC - Abnormal; Notable for the following components:   WBC 11.6 (*)    Hemoglobin 11.4 (*)    Platelets 465 (*)    All other components within normal limits  I-STAT BETA HCG BLOOD, ED ([facility], [facility], [facility] ONLY)  TROPONIN I (HIGH SENSITIVITY)  TROPONIN I (HIGH SENSITIVITY)   ____________________________________________  EKG   EKG Interpretation  Date/Time:  Wednesday July 06 2020 09:55:52 EDT Ventricular Rate:  73 PR Interval:  172 QRS Duration: 86 QT Interval:  376 QTC Calculation: 414 R Axis:   44 Text Interpretation: Normal sinus rhythm Normal ECG No STEMI Confirmed by Alona Bene [phone]) on 07/06/2020 3:00:12 PM       ____________________________________________  RADIOLOGY  DG Chest 2 View  Result Date: 07/06/2020 CLINICAL DATA:  Chest pain EXAM: CHEST - 2 VIEW COMPARISON:  10/04/2017 FINDINGS: Bandlike opacity over the right mid lung. There is no edema, consolidation, effusion, or pneumothorax. Normal heart size and mediastinal contours. IMPRESSION: Mild atelectasis on the right. Electronically Signed   By: Marnee Spring M.D.   On: 07/06/2020 10:35    ____________________________________________   PROCEDURES  Procedure(s) performed:   Procedures   ____________________________________________   INITIAL IMPRESSION / ASSESSMENT AND PLAN / ED COURSE  Pertinent labs & imaging results that were available during my care of the patient were reviewed by me and considered in my medical decision making (see chart for details).   Patient presents with sharp, pleuritic chest pain after surgery.  She is at elevated risk for DVT given this.  Vital signs are reassuring, however.  Patient's EKG with no acute ischemic change.  Her initial troponin is 3 but my suspicion for ACS is exceedingly low.  She has a mild leukocytosis consistent with her recent surgery.  The wound does not appear infected.  There is no  clinical findings on exam to suspect that the hernia has returned.  On postop day 2 would not expect infection in the operative bed. Given elevated risk by Anner Crete of PE will skip D-dimer and moved to CTA chest.  Patient CT scan of the chest abdomen pelvis reviewed showing no acute pulmonary embolism.  Discussed with the surgery team on call regarding the CT abdomen pelvis findings and thought to be normal postoperative findings.  Clinically patient does not appear febrile and does not appear to have an abscess.  Patient to call the surgery office tomorrow morning and schedule follow-up in the next 2 days. ____________________________________________  FINAL CLINICAL IMPRESSION(S) / ED DIAGNOSES  Final diagnoses:  Pleuritic chest pain  Incisional pain  Post-operative pain     MEDICATIONS GIVEN DURING THIS VISIT:  Medications  morphine 4 MG/ML injection 4 mg (4 mg Intravenous Given 07/06/20 1600)  ondansetron (ZOFRAN) injection 4 mg (4 mg Intravenous Given 07/06/20 1601)  iohexol (OMNIPAQUE) 350 MG/ML injection 100 mL (100 mLs Intravenous Contrast Given 07/06/20 1721)     Note:  This document was prepared using Dragon voice recognition software and may include unintentional dictation errors.  Alona Bene, MD, [facility]    Aron Inge, Arlyss Repress, MD 07/11/20 [id number]

## 2020-07-06 NOTE — Discharge Instructions (Signed)
You were seen in the emergency room today with chest and abdominal pain.  Your CT scan shows normal changes after surgery.  You do not have any blood clots in your lungs.  You can take your pain medication as prescribed by your surgeon.  The surgery team can see you this week if you call the office first thing tomorrow morning they should be able to set up a wound evaluation appointment but on Friday.  Return to the emergency department any fever, redness around the wound, drainage from the wound, worsening chest pain, shortness of breath.

## 2020-07-06 NOTE — ED Triage Notes (Signed)
Pt reports hernia surgery on Monday, c/o chest pain that began Monday as well as ongoing post op pain that she cannot get under control.

## 2020-07-06 NOTE — ED Notes (Signed)
ED Provider at bedside. 

## 2020-07-06 NOTE — ED Notes (Signed)
Pt's ride outside.Pt ready to leave. Informed Dr. Jacqulyn Bath.

## 2020-07-08 ENCOUNTER — Other Ambulatory Visit: Payer: Self-pay | Admitting: Surgery

## 2021-01-10 ENCOUNTER — Emergency Department (HOSPITAL_COMMUNITY): Payer: 59

## 2021-01-10 ENCOUNTER — Emergency Department (HOSPITAL_COMMUNITY)
Admission: EM | Admit: 2021-01-10 | Discharge: 2021-01-10 | Disposition: A | Discharge status: Home / Self Care | Payer: 59 | Attending: Emergency Medicine | Admitting: Emergency Medicine

## 2021-01-10 ENCOUNTER — Encounter (HOSPITAL_COMMUNITY): Payer: Self-pay

## 2021-01-10 DIAGNOSIS — Z7982 Long term (current) use of aspirin: Secondary | ICD-10-CM | POA: Diagnosis not present

## 2021-01-10 DIAGNOSIS — R197 Diarrhea, unspecified: Secondary | ICD-10-CM | POA: Insufficient documentation

## 2021-01-10 DIAGNOSIS — Z87891 Personal history of nicotine dependence: Secondary | ICD-10-CM | POA: Diagnosis not present

## 2021-01-10 DIAGNOSIS — R1011 Right upper quadrant pain: Secondary | ICD-10-CM | POA: Diagnosis not present

## 2021-01-10 DIAGNOSIS — R112 Nausea with vomiting, unspecified: Secondary | ICD-10-CM | POA: Insufficient documentation

## 2021-01-10 DIAGNOSIS — R1013 Epigastric pain: Secondary | ICD-10-CM | POA: Diagnosis not present

## 2021-01-10 LAB — URINALYSIS, ROUTINE W REFLEX MICROSCOPIC
Bilirubin Urine: NEGATIVE
Glucose, UA: NEGATIVE mg/dL
Hgb urine dipstick: NEGATIVE
Ketones, ur: NEGATIVE mg/dL
Nitrite: NEGATIVE
Protein, ur: NEGATIVE mg/dL
Specific Gravity, Urine: 1.04 — ABNORMAL HIGH (ref 1.005–1.030)
pH: 6 (ref 5.0–8.0)

## 2021-01-10 LAB — COMPREHENSIVE METABOLIC PANEL
ALT: 10 U/L (ref 0–44)
AST: 12 U/L — ABNORMAL LOW (ref 15–41)
Albumin: 3.8 g/dL (ref 3.5–5.0)
Alkaline Phosphatase: 56 U/L (ref 38–126)
Anion gap: 8 (ref 5–15)
BUN: 8 mg/dL (ref 6–20)
CO2: 25 mmol/L (ref 22–32)
Calcium: 8.7 mg/dL — ABNORMAL LOW (ref 8.9–10.3)
Chloride: 108 mmol/L (ref 98–111)
Creatinine, Ser: 0.72 mg/dL (ref 0.44–1.00)
GFR, Estimated: >60 mL/min (ref >60)
Glucose, Bld: 85 mg/dL (ref 70–99)
Potassium: 3.2 mmol/L — ABNORMAL LOW (ref 3.5–5.1)
Sodium: 141 mmol/L (ref 135–145)
Total Bilirubin: 0.9 mg/dL (ref 0.3–1.2)
Total Protein: 7 g/dL (ref 6.5–8.1)

## 2021-01-10 LAB — CBC
HCT: 30.5 % — ABNORMAL LOW (ref 36.0–46.0)
Hemoglobin: 9.5 g/dL — ABNORMAL LOW (ref 12.0–15.0)
MCH: 27 pg (ref 26.0–34.0)
MCHC: 31.1 g/dL (ref 30.0–36.0)
MCV: 86.6 fL (ref 80.0–100.0)
Platelets: 403 10*3/uL — ABNORMAL HIGH (ref 150–400)
RBC: 3.52 MIL/uL — ABNORMAL LOW (ref 3.87–5.11)
RDW: 13.6 % (ref 11.5–15.5)
WBC: 10.3 10*3/uL (ref 4.0–10.5)
nRBC: 0 % (ref 0.0–0.2)

## 2021-01-10 LAB — LIPASE, BLOOD: Lipase: 25 U/L (ref 11–51)

## 2021-01-10 LAB — I-STAT BETA HCG BLOOD, ED (MC, WL, AP ONLY): I-stat hCG, quantitative: <5 m[IU]/mL (ref <5)

## 2021-01-10 MED ORDER — FENTANYL CITRATE (PF) 100 MCG/2ML IJ SOLN
50.0000 ug | Freq: Once | INTRAMUSCULAR | Status: AC
  Administered 2021-01-10: 50 ug via INTRAVENOUS
  Filled 2021-01-10: qty 2

## 2021-01-10 MED ORDER — DIPHENHYDRAMINE HCL 50 MG/ML IJ SOLN
25.0000 mg | Freq: Once | INTRAMUSCULAR | Status: AC
  Administered 2021-01-10: 25 mg via INTRAVENOUS
  Filled 2021-01-10: qty 1

## 2021-01-10 MED ORDER — ONDANSETRON 4 MG PO TBDP: ORAL_TABLET | ORAL | 0 refills | Status: DC

## 2021-01-10 MED ORDER — MORPHINE SULFATE (PF) 4 MG/ML IV SOLN
4.0000 mg | Freq: Once | INTRAVENOUS | Status: AC
  Administered 2021-01-10: 4 mg via INTRAVENOUS
  Filled 2021-01-10: qty 1

## 2021-01-10 MED ORDER — IOHEXOL 300 MG/ML  SOLN
100.0000 mL | Freq: Once | INTRAMUSCULAR | Status: AC | PRN
  Administered 2021-01-10: 100 mL via INTRAVENOUS

## 2021-01-10 MED ORDER — ONDANSETRON HCL 4 MG/2ML IJ SOLN
4.0000 mg | Freq: Once | INTRAMUSCULAR | Status: AC
  Administered 2021-01-10: 4 mg via INTRAVENOUS
  Filled 2021-01-10: qty 2

## 2021-01-10 MED ORDER — LACTATED RINGERS IV BOLUS
1000.0000 mL | Freq: Once | INTRAVENOUS | Status: AC
  Administered 2021-01-10: 1000 mL via INTRAVENOUS

## 2021-01-10 MED ORDER — FAMOTIDINE IN NACL 20-0.9 MG/50ML-% IV SOLN
20.0000 mg | Freq: Once | INTRAVENOUS | Status: AC
  Administered 2021-01-10: 20 mg via INTRAVENOUS
  Filled 2021-01-10: qty 50

## 2021-01-10 NOTE — ED Triage Notes (Signed)
Pt presents with c/o abdominal pain for approx one month. Pt reports the pain became worse over the last couple of days and she is now unable to keep anything down.

## 2021-01-10 NOTE — ED Provider Notes (Signed)
  Physical Exam  BP 131/80   Pulse 74   Temp 98.8 F (37.1 C) (Oral)   Resp 17   Ht 5\' 3"  (1.6 m)   Wt 99.8 kg   LMP 12/26/2020 (Approximate)   SpO2 100%   BMI 38.97 kg/m   Physical Exam  ED Course/Procedures     Procedures  MDM  Care assumed at 4 PM.  Patient has epigastric pain and right upper quadrant pain.  Patient has CT abdomen pelvis pending and if it is unremarkable may need right upper quadrant ultrasound.  7:54 PM Patient's CT abdomen pelvis did not show any acute process.  Still had some pains per podiatry ultrasound performed and was unremarkable.  Patient has a history of stomach ulcer in the past.  She is on Zantac which helps her symptoms but she states that Carafate and PPI didn't help. Patient has no endoscopy in our system and states that she has not seen GI before.  Will discharge patient home with nausea medicine and have her follow-up with GI      12/28/2020, MD 01/10/21 03/12/21

## 2021-01-10 NOTE — ED Notes (Signed)
EDP at bedside at this time.  

## 2021-01-10 NOTE — ED Provider Notes (Signed)
[facility]-EMERGENCY DEPT Provider Note   CSN: [id number] Arrival date & time: 01/10/21  1323     History Chief Complaint  Patient presents with  . Abdominal Pain    Rachael Moyer is a 45 y.o. female.  Patient is a 45 year old female with a history of ventral hernia repair and prior gastric ulcer who is presenting today with worsening abdominal pain.  Patient states for the last 1 month she has had intermittent abdominal pain, nausea but for the last 2 days the pain has been excruciating 9 out of 10 with ongoing vomiting and diarrhea.  She has not been able to hold anything down because every time she drinks she will vomit.  She has not noted any fevers.  She does report she has had a ventral hernia repair in the past but has not had issues like this.  Over the last month she will get severe pain that will last for 1 to 2 hours and then resolved completely on its own.  She is not sought evaluation for this until today because the pain has not resolved.  She has no urinary complaints at this time.  She has had minimal episodes of diarrhea but reports that has been ongoing since her hernia repair.  LMP was this month and she has not missed any periods.  She denies any vaginal discharge or discomfort.  She said no chest pain, cough or shortness of breath.  The history is provided by the patient.  Abdominal Pain Pain location:  Epigastric, RUQ and LUQ      Past Medical History:  Diagnosis Date  . Anxiety   . Bunion   . Gastric ulcer   . GERD (gastroesophageal reflux disease)   . Headache   . Irritable bowel     Patient Active Problem List   Diagnosis Date Noted  . Abdominal cramping 08/14/2017  . Pap smear for cervical cancer screening 08/14/2017  . Generalized anxiety disorder 05/16/2017  . Bunion     Past Surgical History:  Procedure Laterality Date  . FOOT SURGERY    . VENTRAL HERNIA REPAIR N/A 07/04/2020   Procedure: VENTRAL HERNIA REPAIR WITH  MESH;  Surgeon: Abigail Miyamoto, MD;  Location: [facility];  Service: General;  Laterality: N/A;  LMA     OB History   No obstetric history on file.     Family History  Adopted: Yes    Social History   Tobacco Use  . Smoking status: Former Smoker    Types: Cigarettes  . Smokeless tobacco: Never Used  Vaping Use  . Vaping Use: Never used  Substance Use Topics  . Alcohol use: Yes    Comment: 1/month  . Drug use: No    Home Medications Prior to Admission medications   Medication Sig Start Date End Date Taking? Authorizing Provider  aspirin-acetaminophen-caffeine (EXCEDRIN MIGRAINE) [id number] MG tablet Take 2 tablets by mouth every 6 (six) hours as needed for headache.    [provider]  oxyCODONE-acetaminophen (PERCOCET) 5-325 MG tablet Take 1 tablet by mouth every 6 (six) hours as needed for severe pain. 07/04/20 07/04/21  Abigail Miyamoto, MD  ranitidine (ZANTAC) 150 MG tablet Take 150 mg by mouth daily as needed for heartburn.    [provider]    Allergies    Patient has no known allergies.  Review of Systems   Review of Systems  Gastrointestinal: Positive for abdominal pain.  All other systems reviewed and are negative.  Physical Exam Updated Vital Signs BP 124/74 (BP Location: Right Arm)   Pulse 65   Temp 98.8 F (37.1 C) (Oral)   Resp 18   Ht 5\' 3"  (1.6 m)   Wt 99.8 kg   LMP 12/26/2020 (Approximate)   SpO2 100%   BMI 38.97 kg/m   Physical Exam Vitals and nursing note reviewed.  Constitutional:      General: She is not in acute distress.    Appearance: She is well-developed and well-nourished.     Comments: Appears uncomfortable  HENT:     Head: Normocephalic and atraumatic.  Eyes:     Extraocular Movements: EOM normal.     Pupils: Pupils are equal, round, and reactive to light.  Cardiovascular:     Rate and Rhythm: Normal rate and regular rhythm.     Pulses: Intact distal pulses.     Heart sounds: Normal  heart sounds. No murmur heard. No friction rub.  Pulmonary:     Effort: Pulmonary effort is normal.     Breath sounds: Normal breath sounds. No wheezing or rales.  Abdominal:     General: Bowel sounds are normal. There is no distension.     Palpations: Abdomen is soft.     Tenderness: There is abdominal tenderness in the right upper quadrant and epigastric area. There is guarding. There is no right CVA tenderness, left CVA tenderness or rebound. Positive signs include Murphy's sign.  Musculoskeletal:        General: No tenderness. Normal range of motion.     Comments: No edema  Skin:    General: Skin is warm and dry.     Findings: No rash.  Neurological:     General: No focal deficit present.     Mental Status: She is alert and oriented to person, place, and time. Mental status is at baseline.     Cranial Nerves: No cranial nerve deficit.  Psychiatric:        Mood and Affect: Mood and affect and mood normal.        Behavior: Behavior normal.        Thought Content: Thought content normal.     ED Results / Procedures / Treatments   Labs (all labs ordered are listed, but only abnormal results are displayed) Labs Reviewed  CBC - Abnormal; Notable for the following components:      Result Value   RBC 3.52 (*)    Hemoglobin 9.5 (*)    HCT 30.5 (*)    Platelets 403 (*)    All other components within normal limits  LIPASE, BLOOD  COMPREHENSIVE METABOLIC PANEL  URINALYSIS, ROUTINE W REFLEX MICROSCOPIC  I-STAT BETA HCG BLOOD, ED ([facility], [facility], [facility] ONLY)    EKG None  Radiology No results found.  Procedures Procedures   Medications Ordered in ED Medications  lactated ringers bolus 1,000 mL (1,000 mLs Intravenous Bolus 01/10/21 1429)  morphine 4 MG/ML injection 4 mg (4 mg Intravenous Given 01/10/21 1429)  ondansetron (ZOFRAN) injection 4 mg (4 mg Intravenous Given 01/10/21 1429)  fentaNYL (SUBLIMAZE) injection 50 mcg (50 mcg Intravenous Given 01/10/21 1533)    ED Course  I have  reviewed the triage vital signs and the nursing notes.  Pertinent labs & imaging results that were available during my care of the patient were reviewed by me and considered in my medical decision making (see chart for details).    MDM Rules/Calculators/A&P  Patient presenting today with ongoing abdominal pain that has been present for a month but much worse in the last 2 days now with vomiting and inability to tolerate p.o.'s.  On exam patient had significant pain in her epigastrium and right upper quadrant with a positive Murphy's.  She has had prior history of ventral hernia repair without any bulging or specific deformities noted over the surgical scar.  She also has a history of gastric ulcer.  Concern for cholecystitis versus hepatitis versus pancreatitis versus gastric ulcer with perforation.  Lower suspicion for recurrent hernia with incarceration.  Patient was given morphine which actually worsened her pain.  She was given IV fluids, antiemetics and changed to fentanyl for pain control.  hCG and lipase are within normal limits today, CBC without acute findings.  CMP within normal limits.  We will get a CT to further evaluate.  Final Clinical Impression(s) / ED Diagnoses Final diagnoses:  None    Rx / DC Orders ED Discharge Orders    None       Gwyneth Sprout, MD 01/10/21 1726

## 2021-01-10 NOTE — ED Notes (Addendum)
Pt states "the morphine made the pain worse. It worked for a minute. Now it is worse" pt is tearful and restless in bed. MD made aware

## 2021-01-10 NOTE — Discharge Instructions (Addendum)
Continue taking your Zantac.  Take zofran for nausea   Your CT abdomen pelvis and ultrasound were unremarkable.  There is an incidental cyst in your kidney that can be followed up but is not causing your pain.  See GI for follow up. You may need endoscopy   Return to ER if you have worse epigastric pain, vomiting, dehydration

## 2021-01-10 NOTE — ED Notes (Signed)
Pt ambulated to restroom without difficulty at this time. Silverio Lay MD at bedside

## 2021-01-10 NOTE — ED Notes (Signed)
Pt is requesting benadryl due to medication causing itching. MD aware

## 2021-01-31 ENCOUNTER — Other Ambulatory Visit: Payer: Self-pay | Admitting: Surgery

## 2021-02-01 ENCOUNTER — Other Ambulatory Visit (HOSPITAL_COMMUNITY): Payer: Self-pay | Admitting: Surgery

## 2021-02-01 ENCOUNTER — Other Ambulatory Visit: Payer: Self-pay | Admitting: Surgery

## 2021-02-01 DIAGNOSIS — R1084 Generalized abdominal pain: Secondary | ICD-10-CM

## 2021-02-15 ENCOUNTER — Ambulatory Visit (HOSPITAL_COMMUNITY)
Admission: RE | Admit: 2021-02-15 | Discharge: 2021-02-15 | Disposition: A | Discharge status: Home / Self Care | Payer: 59 | Source: Ambulatory Visit | Attending: Surgery | Admitting: Surgery

## 2021-02-15 ENCOUNTER — Other Ambulatory Visit: Payer: Self-pay

## 2021-02-15 DIAGNOSIS — R1084 Generalized abdominal pain: Secondary | ICD-10-CM | POA: Diagnosis present

## 2021-02-15 MED ORDER — TECHNETIUM TC 99M MEBROFENIN IV KIT
5.2000 | PACK | Freq: Once | INTRAVENOUS | Status: AC | PRN
  Administered 2021-02-15: 5.2 via INTRAVENOUS

## 2021-02-25 ENCOUNTER — Encounter (INDEPENDENT_AMBULATORY_CARE_PROVIDER_SITE_OTHER): Payer: Self-pay

## 2021-03-09 ENCOUNTER — Encounter (INDEPENDENT_AMBULATORY_CARE_PROVIDER_SITE_OTHER): Payer: Self-pay

## 2021-03-23 ENCOUNTER — Other Ambulatory Visit: Payer: Self-pay

## 2021-03-23 ENCOUNTER — Encounter (HOSPITAL_COMMUNITY): Payer: Self-pay

## 2021-03-23 ENCOUNTER — Ambulatory Visit (HOSPITAL_COMMUNITY)
Admission: EM | Admit: 2021-03-23 | Discharge: 2021-03-23 | Disposition: A | Discharge status: Home / Self Care | Payer: 59

## 2021-03-23 DIAGNOSIS — L282 Other prurigo: Secondary | ICD-10-CM | POA: Diagnosis not present

## 2021-03-23 DIAGNOSIS — R21 Rash and other nonspecific skin eruption: Secondary | ICD-10-CM

## 2021-03-23 MED ORDER — PREDNISONE 10 MG (21) PO TBPK: ORAL_TABLET | ORAL | 0 refills | Status: DC

## 2021-03-23 NOTE — ED Triage Notes (Signed)
Pt reports itchy rash all over the body x 2 days. Benadryl gives some relief;  hydrocortisone cream and calamine gives no relief.  Denies new food, detergents, medications, outdoor activities.

## 2021-03-23 NOTE — ED Provider Notes (Signed)
[facility]    CSN: [id number] Arrival date & time: 03/23/21  1152      History   Chief Complaint Chief Complaint  Patient presents with  . Rash    HPI Rachael Moyer is a 45 y.o. female.   Patient presents today with 2-day history of widespread pruritic rash.  She denies any changes to personal hygiene products including soaps or detergents.  Denies any recent medication changes or antibiotic use.  She denies history of dermatological condition.  She denies history of allergies or sensitive skin.  She does report some decreased appetite but denies additional symptoms including fever, nausea, vomiting, URI symptoms.  She has tried Benadryl and calamine lotion without improvement of symptoms.  Denies any household contacts with similar symptoms.  Denies any insect or poison ivy exposure.     Past Medical History:  Diagnosis Date  . Anxiety   . Bunion   . Gastric ulcer   . GERD (gastroesophageal reflux disease)   . Headache   . Irritable bowel     Patient Active Problem List   Diagnosis Date Noted  . Abdominal cramping 08/14/2017  . Pap smear for cervical cancer screening 08/14/2017  . Generalized anxiety disorder 05/16/2017  . Bunion     Past Surgical History:  Procedure Laterality Date  . FOOT SURGERY    . VENTRAL HERNIA REPAIR N/A 07/04/2020   Procedure: VENTRAL HERNIA REPAIR WITH MESH;  Surgeon: Abigail Miyamoto, MD;  Location: [facility];  Service: General;  Laterality: N/A;  LMA    OB History   No obstetric history on file.      Home Medications    Prior to Admission medications   Medication Sig Start Date End Date Taking? Authorizing Provider  calamine lotion Apply 1 application topically as needed for itching.   Yes [provider]  diphenhydrAMINE (BENADRYL) 50 MG tablet Take 50 mg by mouth at bedtime as needed for itching.   Yes [provider]  hydrocortisone cream 1 % Apply 1 application topically 2  (two) times daily.   Yes [provider]  predniSONE (STERAPRED UNI-PAK 21 TAB) 10 MG (21) TBPK tablet As directed 03/23/21  Yes Ercel Normoyle K, PA-C  aspirin-acetaminophen-caffeine (EXCEDRIN MIGRAINE) 250-250-65 MG tablet Take 2 tablets by mouth every 6 (six) hours as needed for headache.    [provider]  ibuprofen (ADVIL) 200 MG tablet Take 800 mg by mouth in the morning and at bedtime.    [provider]  ondansetron (ZOFRAN ODT) 4 MG disintegrating tablet 4mg  ODT q4 hours prn nausea/vomit 01/10/21   03/12/21, MD  oxyCODONE-acetaminophen (PERCOCET) 5-325 MG tablet Take 1 tablet by mouth every 6 (six) hours as needed for severe pain. Patient not taking: No sig reported 07/04/20 07/04/21  07/06/21, MD  ranitidine (ZANTAC) 150 MG tablet Take 150 mg by mouth daily as needed for heartburn.    [provider]    Family History Family History  Adopted: Yes    Social History Social History   Tobacco Use  . Smoking status: Former Smoker    Types: Cigarettes  . Smokeless tobacco: Never Used  Vaping Use  . Vaping Use: Never used  Substance Use Topics  . Alcohol use: Yes    Comment: 1/month  . Drug use: No     Allergies   Patient has no known allergies.   Review of Systems Review of Systems  Constitutional: Positive for appetite change. Negative for  activity change, fatigue and fever.  HENT: Negative for congestion, sinus pressure, sneezing and sore throat.   Respiratory: Negative for cough and shortness of breath.   Cardiovascular: Negative for chest pain.  Gastrointestinal: Negative for abdominal pain, diarrhea, nausea and vomiting.  Musculoskeletal: Negative for arthralgias and myalgias.  Skin: Positive for rash.  Neurological: Negative for dizziness, light-headedness and headaches.     Physical Exam Triage Vital Signs ED Triage Vitals  Enc Vitals Group     BP 03/23/21 1241 (!) 139/94     Pulse Rate 03/23/21 1241 92      Resp 03/23/21 1241 16     Temp 03/23/21 1241 98 F (36.7 C)     Temp Source 03/23/21 1241 Oral     SpO2 03/23/21 1241 100 %     Weight --      Height --      Head Circumference --      Peak Flow --      Pain Score 03/23/21 1238 0     Pain Loc --      Pain Edu? --      Excl. in GC? --    No data found.  Updated Vital Signs BP (!) 139/94 (BP Location: Left Arm)   Pulse 92   Temp 98 F (36.7 C) (Oral)   Resp 16   LMP 03/07/2021 (Exact Date)   SpO2 100%   Visual Acuity Right Eye Distance:   Left Eye Distance:   Bilateral Distance:    Right Eye Near:   Left Eye Near:    Bilateral Near:     Physical Exam Vitals reviewed.  Constitutional:      General: She is awake. She is not in acute distress.    Appearance: Normal appearance. She is not ill-appearing.     Comments: Very pleasant female appears stated age in no acute distress  HENT:     Head: Normocephalic and atraumatic.     Mouth/Throat:     Pharynx: Uvula midline. No pharyngeal swelling, oropharyngeal exudate, posterior oropharyngeal erythema or uvula swelling.  Cardiovascular:     Rate and Rhythm: Normal rate and regular rhythm.     Heart sounds: No murmur heard.   Pulmonary:     Effort: Pulmonary effort is normal.     Breath sounds: Normal breath sounds. No wheezing, rhonchi or rales.     Comments: Clear to auscultation bilaterally Abdominal:     Palpations: Abdomen is soft.     Tenderness: There is no abdominal tenderness.  Skin:    Findings: Rash present. Rash is papular. Rash is not urticarial or vesicular.     Comments: Widespread papular rash involving extremities, trunk, face.  Lesions are raised and erythematous and do not blanch.  No vesicles noted.  Evidence of excoriation present.  No streaking or evidence of lymphangitis.  No bleeding or drainage noted.  Psychiatric:        Behavior: Behavior is cooperative.      UC Treatments / Results  Labs (all labs ordered are listed, but only  abnormal results are displayed) Labs Reviewed - No data to display  EKG   Radiology No results found.  Procedures Procedures (including critical care time)  Medications Ordered in UC Medications - No data to display  Initial Impression / Assessment and Plan / UC Course  I have reviewed the triage vital signs and the nursing notes.  Pertinent labs & imaging results that were available during my care of the patient  were reviewed by me and considered in my medical decision making (see chart for details).     Unclear etiology of symptoms but given pruritic nature concern for allergic component to patient started on prednisone taper.  She was instructed not to take NSAIDs with this medication.  Discussed the importance of alternating H1 and H2 blockers.  Recommended hypoallergenic soaps and detergents.  Discussed that if symptoms persist she should follow-up with PCP and/or dermatology.  Strict return precautions given to which patient expressed understanding.  Final Clinical Impressions(s) / UC Diagnoses   Final diagnoses:  Papular rash  Pruritic rash     Discharge Instructions     Continue taking Zantac every day and Benadryl to manage her symptoms.  We will start you on prednisone.  While you are taking this do not take over-the-counter NSAIDs including aspirin, ibuprofen/Advil, naproxen/Aleve as they can cause stomach issues.  You can continue over-the-counter medications including calamine and hydrocortisone cream to manage symptoms.  Use hypoallergenic soaps and detergents.  If you have any worsening symptoms you need to return for reevaluation.    ED Prescriptions    Medication Sig Dispense Auth. Provider   predniSONE (STERAPRED UNI-PAK 21 TAB) 10 MG (21) TBPK tablet As directed 21 tablet Zekiah Coen K, PA-C     PDMP not reviewed this encounter.   Jeani Hawking, PA-C 03/23/21 1259

## 2021-03-23 NOTE — Discharge Instructions (Signed)
Continue taking Zantac every day and Benadryl to manage her symptoms.  We will start you on prednisone.  While you are taking this do not take over-the-counter NSAIDs including aspirin, ibuprofen/Advil, naproxen/Aleve as they can cause stomach issues.  You can continue over-the-counter medications including calamine and hydrocortisone cream to manage symptoms.  Use hypoallergenic soaps and detergents.  If you have any worsening symptoms you need to return for reevaluation.

## 2021-04-06 ENCOUNTER — Ambulatory Visit: Payer: Medicaid Other | Admitting: Family Medicine

## 2021-08-29 ENCOUNTER — Encounter: Payer: Self-pay | Admitting: Family Medicine

## 2021-08-29 ENCOUNTER — Ambulatory Visit (INDEPENDENT_AMBULATORY_CARE_PROVIDER_SITE_OTHER): Payer: 59 | Admitting: Family Medicine

## 2021-08-29 ENCOUNTER — Other Ambulatory Visit: Payer: Self-pay

## 2021-08-29 VITALS — BP 126/74 | HR 94 | Ht 63.0 in | Wt 226.2 lb

## 2021-08-29 DIAGNOSIS — Z114 Encounter for screening for human immunodeficiency virus [HIV]: Secondary | ICD-10-CM | POA: Diagnosis not present

## 2021-08-29 DIAGNOSIS — Z1159 Encounter for screening for other viral diseases: Secondary | ICD-10-CM

## 2021-08-29 DIAGNOSIS — K219 Gastro-esophageal reflux disease without esophagitis: Secondary | ICD-10-CM

## 2021-08-29 DIAGNOSIS — Z0000 Encounter for general adult medical examination without abnormal findings: Secondary | ICD-10-CM | POA: Diagnosis not present

## 2021-08-29 DIAGNOSIS — G43909 Migraine, unspecified, not intractable, without status migrainosus: Secondary | ICD-10-CM

## 2021-08-29 NOTE — Patient Instructions (Addendum)
It was great seeing you today!  Thank you for choosing [facility] for your PCP needs! Today we did blood work, I will let you know of any abnormal results.  If you are having any more headaches that cause you to have vision changes and weakness then please go to the emergency department.  Please follow up at your next scheduled appointment in 2-4 weeks, if anything arises between now and then, please don't hesitate to contact our office.   Thank you for allowing [place] to be a part of your medical care!  Thank you, Dr. Robyne Peers

## 2021-08-30 DIAGNOSIS — G43909 Migraine, unspecified, not intractable, without status migrainosus: Secondary | ICD-10-CM | POA: Insufficient documentation

## 2021-08-30 DIAGNOSIS — K219 Gastro-esophageal reflux disease without esophagitis: Secondary | ICD-10-CM | POA: Insufficient documentation

## 2021-08-30 LAB — CBC
Hematocrit: 29.5 % — ABNORMAL LOW (ref 34.0–46.6)
Hemoglobin: 9.4 g/dL — ABNORMAL LOW (ref 11.1–15.9)
MCH: 26.2 pg — ABNORMAL LOW (ref 26.6–33.0)
MCHC: 31.9 g/dL (ref 31.5–35.7)
MCV: 82 fL (ref 79–97)
Platelets: 443 10*3/uL (ref 150–450)
RBC: 3.59 x10E6/uL — ABNORMAL LOW (ref 3.77–5.28)
RDW: 14.6 % (ref 11.7–15.4)
WBC: 10.6 10*3/uL (ref 3.4–10.8)

## 2021-08-30 LAB — HCV INTERPRETATION

## 2021-08-30 LAB — BASIC METABOLIC PANEL
BUN/Creatinine Ratio: 10 (ref 9–23)
BUN: 6 mg/dL (ref 6–24)
CO2: 24 mmol/L (ref 20–29)
Calcium: 8.8 mg/dL (ref 8.7–10.2)
Chloride: 104 mmol/L (ref 96–106)
Creatinine, Ser: 0.59 mg/dL (ref 0.57–1.00)
Glucose: 77 mg/dL (ref 70–99)
Potassium: 3.9 mmol/L (ref 3.5–5.2)
Sodium: 141 mmol/L (ref 134–144)
eGFR: 114 mL/min/{1.73_m2} (ref >59)

## 2021-08-30 LAB — HCV AB W REFLEX TO QUANT PCR: HCV Ab: <0.1 s/co ratio (ref 0.0–0.9)

## 2021-08-30 LAB — HIV ANTIBODY (ROUTINE TESTING W REFLEX): HIV Screen 4th Generation wRfx: NONREACTIVE

## 2021-08-30 NOTE — Assessment & Plan Note (Addendum)
-  continue current regimen  -GERD precautions discussed along with avoiding triggers  -discussed switching to omeprazole or H2 blocker instead of ranitidine, patient prefers to stay on current regimen, consider altering this at another visit

## 2021-08-30 NOTE — Progress Notes (Signed)
    SUBJECTIVE:   CHIEF COMPLAINT / HPI:   Presents as new patient to establish care, has not been seen by a PCP for the past 3-4 years. She was previously seen by a provider in [place] for which she does not remember which one. Past medical history significant for GERD, anxiety and migraines. Denies any drug allergies. Prior surgeries include hernia repair and bunion removal. Family history significant for DM, hypertension and HLD. Denies any recent hospitalizations. Eats a balanced diet sometimes, denies exercising but is planning on starting to work out at home together along with her husband. Former smoker, does not smoke currently. Previously occasional alcohol use although has completely stopped over the past 4 months. Endorses THC use which helps alleviate her anxiety and pain from GERD. Sexually active with 1 female partner, her husband. Works in Clinical biochemist at [employer].  Only concern is that about 2-3 months ago she experienced a headache that was similar to her typical migraines however was also simultaneously experiencing brief vision changes and weakness. Excedrin typically helps her migraines. Has not experienced these type of headaches but was concerned as this was a different set of symptoms from her chronic migraines. Denies any particular reliving and aggravating factors. She did not go to get further urgent evaluation when this occurred. Denies history of stroke or speech issues. Denies nausea and vomiting.   PERTINENT  PMH / PSH:   GERD Chronic history of GERD, takes ranitidine daily which she endorses significant improvement with as she experiences epigastric pain sometimes up to 3 times a day if she does not take her medication. Denies eating spicy foods, working on trying to lessen soda consumption. Denies epigastric pain currently. Eats about 3-4 hours prior to bedtime.   OBJECTIVE:   BP 126/74   Pulse 94   Ht 5\' 3"  (1.6 m)   Wt 226 lb 3.2 oz (102.6 kg)   LMP  08/18/2021   SpO2 100%   BMI 40.07 kg/m   General: Patient well-appearing, in no acute distress. HEENT: non-tender thyroid, no evidence of cervical LAD CV: RRR, no murmurs or gallops auscultated  Resp: CTAB Abdomen: soft, nontender, presence of bowel sounds Neuro: alert, answers questions appropriately, normal gait, gross sensation intact Psych: mood appropriate  ASSESSMENT/PLAN:   GERD (gastroesophageal reflux disease) -continue current regimen  -GERD precautions discussed along with avoiding triggers  -discussed switching to omeprazole or H2 blocker instead of ranitidine, patient prefers to stay on current regimen, consider altering this at another visit   Migraine -continue Excedrin for current migraine treatment regimen  -although not experiencing these concerning symptoms now, instructed patient to go to ED if this occurs again, strict ED precautions given and discussed importance of further emergent evaluation if vision changes and/or weakness recur -avoid stressors and other triggers    -Awaiting blood work, will notify patient of any abnormal results  10/18/2021, DO [facility]

## 2021-08-30 NOTE — Assessment & Plan Note (Signed)
-  continue Excedrin for current migraine treatment regimen  -although not experiencing these concerning symptoms now, instructed patient to go to ED if this occurs again, strict ED precautions given and discussed importance of further emergent evaluation if vision changes and/or weakness recur -avoid stressors and other triggers

## 2021-08-31 ENCOUNTER — Other Ambulatory Visit: Payer: Self-pay | Admitting: Family Medicine

## 2021-08-31 DIAGNOSIS — D649 Anemia, unspecified: Secondary | ICD-10-CM

## 2021-09-04 ENCOUNTER — Other Ambulatory Visit: Payer: 59

## 2021-09-07 ENCOUNTER — Other Ambulatory Visit: Payer: 59

## 2022-02-27 ENCOUNTER — Other Ambulatory Visit: Payer: Self-pay

## 2022-02-27 ENCOUNTER — Emergency Department (HOSPITAL_COMMUNITY): Payer: Self-pay

## 2022-02-27 ENCOUNTER — Emergency Department (HOSPITAL_COMMUNITY)
Admission: EM | Admit: 2022-02-27 | Discharge: 2022-02-27 | Disposition: A | Discharge status: Home / Self Care | Payer: Self-pay | Attending: Emergency Medicine | Admitting: Emergency Medicine

## 2022-02-27 DIAGNOSIS — Z7982 Long term (current) use of aspirin: Secondary | ICD-10-CM | POA: Insufficient documentation

## 2022-02-27 DIAGNOSIS — K529 Noninfective gastroenteritis and colitis, unspecified: Secondary | ICD-10-CM

## 2022-02-27 DIAGNOSIS — R112 Nausea with vomiting, unspecified: Secondary | ICD-10-CM | POA: Insufficient documentation

## 2022-02-27 DIAGNOSIS — M549 Dorsalgia, unspecified: Secondary | ICD-10-CM | POA: Insufficient documentation

## 2022-02-27 DIAGNOSIS — R770 Abnormality of albumin: Secondary | ICD-10-CM | POA: Insufficient documentation

## 2022-02-27 DIAGNOSIS — R1013 Epigastric pain: Secondary | ICD-10-CM | POA: Insufficient documentation

## 2022-02-27 DIAGNOSIS — R6883 Chills (without fever): Secondary | ICD-10-CM | POA: Insufficient documentation

## 2022-02-27 DIAGNOSIS — R1084 Generalized abdominal pain: Secondary | ICD-10-CM

## 2022-02-27 LAB — COMPREHENSIVE METABOLIC PANEL
ALT: 10 U/L (ref 0–44)
AST: 14 U/L — ABNORMAL LOW (ref 15–41)
Albumin: 3.4 g/dL — ABNORMAL LOW (ref 3.5–5.0)
Alkaline Phosphatase: 63 U/L (ref 38–126)
Anion gap: 6 (ref 5–15)
BUN: 6 mg/dL (ref 6–20)
CO2: 25 mmol/L (ref 22–32)
Calcium: 8.9 mg/dL (ref 8.9–10.3)
Chloride: 111 mmol/L (ref 98–111)
Creatinine, Ser: 0.85 mg/dL (ref 0.44–1.00)
GFR, Estimated: >60 mL/min (ref >60)
Glucose, Bld: 98 mg/dL (ref 70–99)
Potassium: 3.6 mmol/L (ref 3.5–5.1)
Sodium: 142 mmol/L (ref 135–145)
Total Bilirubin: 0.8 mg/dL (ref 0.3–1.2)
Total Protein: 6.7 g/dL (ref 6.5–8.1)

## 2022-02-27 LAB — URINALYSIS, ROUTINE W REFLEX MICROSCOPIC
Bilirubin Urine: NEGATIVE
Glucose, UA: NEGATIVE mg/dL
Hgb urine dipstick: NEGATIVE
Ketones, ur: NEGATIVE mg/dL
Leukocytes,Ua: NEGATIVE
Nitrite: NEGATIVE
Protein, ur: NEGATIVE mg/dL
Specific Gravity, Urine: 1.018 (ref 1.005–1.030)
pH: 6 (ref 5.0–8.0)

## 2022-02-27 LAB — CBC
HCT: 31 % — ABNORMAL LOW (ref 36.0–46.0)
Hemoglobin: 9.4 g/dL — ABNORMAL LOW (ref 12.0–15.0)
MCH: 25.7 pg — ABNORMAL LOW (ref 26.0–34.0)
MCHC: 30.3 g/dL (ref 30.0–36.0)
MCV: 84.7 fL (ref 80.0–100.0)
Platelets: 435 10*3/uL — ABNORMAL HIGH (ref 150–400)
RBC: 3.66 MIL/uL — ABNORMAL LOW (ref 3.87–5.11)
RDW: 15.3 % (ref 11.5–15.5)
WBC: 7.8 10*3/uL (ref 4.0–10.5)
nRBC: 0 % (ref 0.0–0.2)

## 2022-02-27 LAB — LIPASE, BLOOD: Lipase: 26 U/L (ref 11–51)

## 2022-02-27 LAB — I-STAT BETA HCG BLOOD, ED (MC, WL, AP ONLY): I-stat hCG, quantitative: <5 m[IU]/mL (ref <5)

## 2022-02-27 MED ORDER — DIPHENHYDRAMINE HCL 50 MG/ML IJ SOLN
25.0000 mg | Freq: Once | INTRAMUSCULAR | Status: AC
  Administered 2022-02-27: 25 mg via INTRAVENOUS
  Filled 2022-02-27: qty 1

## 2022-02-27 MED ORDER — MORPHINE SULFATE (PF) 4 MG/ML IV SOLN
4.0000 mg | Freq: Once | INTRAVENOUS | Status: AC
  Administered 2022-02-27: 4 mg via INTRAVENOUS
  Filled 2022-02-27: qty 1

## 2022-02-27 MED ORDER — IOHEXOL 300 MG/ML  SOLN
100.0000 mL | Freq: Once | INTRAMUSCULAR | Status: AC | PRN
  Administered 2022-02-27: 100 mL via INTRAVENOUS

## 2022-02-27 MED ORDER — ALUM & MAG HYDROXIDE-SIMETH 200-200-20 MG/5ML PO SUSP
30.0000 mL | Freq: Once | ORAL | Status: AC
  Administered 2022-02-27: 30 mL via ORAL
  Filled 2022-02-27: qty 30

## 2022-02-27 MED ORDER — DICYCLOMINE HCL 20 MG PO TABS: 20.0000 mg | ORAL_TABLET | Freq: Two times a day (BID) | ORAL | 0 refills | Status: DC

## 2022-02-27 MED ORDER — ONDANSETRON 4 MG PO TBDP: ORAL_TABLET | ORAL | 0 refills | Status: DC

## 2022-02-27 MED ORDER — ONDANSETRON HCL 4 MG/2ML IJ SOLN
4.0000 mg | Freq: Once | INTRAMUSCULAR | Status: AC
  Administered 2022-02-27: 4 mg via INTRAVENOUS
  Filled 2022-02-27: qty 2

## 2022-02-27 MED ORDER — LIDOCAINE VISCOUS HCL 2 % MT SOLN
15.0000 mL | Freq: Once | OROMUCOSAL | Status: AC
  Administered 2022-02-27: 15 mL via ORAL
  Filled 2022-02-27: qty 15

## 2022-02-27 MED ORDER — DICYCLOMINE HCL 10 MG PO CAPS
10.0000 mg | ORAL_CAPSULE | Freq: Once | ORAL | Status: AC
  Administered 2022-02-27: 10 mg via ORAL
  Filled 2022-02-27: qty 1

## 2022-02-27 MED ORDER — AMOXICILLIN-POT CLAVULANATE 875-125 MG PO TABS: 1.0000 | ORAL_TABLET | Freq: Two times a day (BID) | ORAL | 0 refills | Status: DC

## 2022-02-27 NOTE — ED Provider Notes (Signed)
?[facility] ?Provider Note ? ? ?CSN: [id number] ?Arrival date & time: 02/27/22  0854 ? ?  ? ?History ? ?Chief Complaint  ?Patient presents with  ? Abdominal Pain  ? Nausea  ? Emesis  ? ? ?Rachael Orvan FalconerCampbell is a 46 y.o. female with past medical history significant for anxiety, recurrent abdominal cramping, acid reflux, migraines who presents with severe stomach, back pain, nausea, vomiting since yesterday.  She reports that she had some severe vomiting this morning, she has not had any vomiting since she has been waiting to be seen, but severe nausea.  She reports that she would have vomited but she had nothing on her stomach.  Patient reports that she has had history of hernia repair in the abdomen.  She has some chronic anemia related to her menstrual cycle.  Last menstrual period was not regular.  Patient denies dysuria, hematuria, diarrhea, blood in stool or emesis.  Patient reports that no one around her has been sick.  She endorses some chills but denies any objective fever.  She has not tried anything for the pain at home other than some over-the-counter pain medication.  Abdominal pain 10/10 reports that radiates to her back.  She denies history of pancreatitis, alcohol abuse.  She reports that she uses marijuana for nausea around 3 times weekly. ? ? ?Abdominal Pain ?Associated symptoms: nausea and vomiting   ?Emesis ?Associated symptoms: abdominal pain   ? ?  ? ?Home Medications ?Prior to Admission medications   ?Medication Sig Start Date End Date Taking? Authorizing Provider  ?aspirin-acetaminophen-caffeine (EXCEDRIN MIGRAINE) 250-250-65 MG tablet Take 2 tablets by mouth every 6 (six) hours as needed for headache.    [provider]  ?calamine lotion Apply 1 application topically as needed for itching. ?Patient not taking: Reported on 08/29/2021    [provider]  ?diphenhydrAMINE (BENADRYL) 50 MG tablet Take 50 mg by mouth at bedtime as needed for  itching. ?Patient not taking: Reported on 08/29/2021    [provider]  ?hydrocortisone cream 1 % Apply 1 application topically 2 (two) times daily. ?Patient not taking: Reported on 08/29/2021    [provider]  ?ibuprofen (ADVIL) 200 MG tablet Take 800 mg by mouth in the morning and at bedtime.    [provider]  ?ondansetron (ZOFRAN ODT) 4 MG disintegrating tablet 4mg  ODT q4 hours prn nausea/vomit ?Patient not taking: Reported on 08/29/2021 01/10/21   Charlynne PanderYao, David Hsienta, MD  ?predniSONE (STERAPRED UNI-PAK 21 TAB) 10 MG (21) TBPK tablet As directed ?Patient not taking: Reported on 08/29/2021 03/23/21   Raspet, Noberto RetortErin K, PA-C  ?ranitidine (ZANTAC) 150 MG tablet Take 150 mg by mouth daily as needed for heartburn.    [provider]  ?   ? ?Allergies    ?Patient has no known allergies.   ? ?Review of Systems   ?Review of Systems  ?Gastrointestinal:  Positive for abdominal pain, nausea and vomiting.  ?All other systems reviewed and are negative. ? ?Physical Exam ?Updated Vital Signs ?BP (!) 143/88   Pulse (!) 57   Temp 98.6 ?F (37 ?C) (Oral)   Resp 11   Ht 5\' 3"  (1.6 m)   Wt 94.8 kg   LMP 02/18/2022   SpO2 100%   BMI 37.02 kg/m?  ?Physical Exam ?Vitals and nursing note reviewed.  ?Constitutional:   ?   General: She is not in acute distress. ?   Appearance: Normal appearance.  ?HENT:  ?   Head:  Normocephalic and atraumatic.  ?Eyes:  ?   General:     ?   Right eye: No discharge.     ?   Left eye: No discharge.  ?Cardiovascular:  ?   Rate and Rhythm: Normal rate and regular rhythm.  ?   Heart sounds: No murmur heard. ?  No friction rub. No gallop.  ?Pulmonary:  ?   Effort: Pulmonary effort is normal.  ?   Breath sounds: Normal breath sounds.  ?Abdominal:  ?   General: Bowel sounds are normal.  ?   Palpations: Abdomen is soft.  ?   Comments: Patient with some significant tenderness to palpation in the epigastric region.  She has no rebound, rigidity, guarding.  She has no  significant tenderness throughout the rest of the abdomen, no suprapubic tenderness to palpation.  She has negative CVA tenderness.  ?Skin: ?   General: Skin is warm and dry.  ?   Capillary Refill: Capillary refill takes less than 2 seconds.  ?Neurological:  ?   Mental Status: She is alert and oriented to person, place, and time.  ?Psychiatric:     ?   Mood and Affect: Mood normal.     ?   Behavior: Behavior normal.  ? ? ?ED Results / Procedures / Treatments   ?Labs ?(all labs ordered are listed, but only abnormal results are displayed) ?Labs Reviewed  ?COMPREHENSIVE METABOLIC PANEL - Abnormal; Notable for the following components:  ?    Result Value  ? Albumin 3.4 (*)   ? AST 14 (*)   ? All other components within normal limits  ?CBC - Abnormal; Notable for the following components:  ? RBC 3.66 (*)   ? Hemoglobin 9.4 (*)   ? HCT 31.0 (*)   ? MCH 25.7 (*)   ? Platelets 435 (*)   ? All other components within normal limits  ?LIPASE, BLOOD  ?URINALYSIS, ROUTINE W REFLEX MICROSCOPIC  ?RAPID URINE DRUG SCREEN, HOSP PERFORMED  ?I-STAT BETA HCG BLOOD, ED ([facility], [facility], [facility] ONLY)  ? ? ?EKG ?None ? ?Radiology ?No results found. ? ?Procedures ?Procedures  ? ? ?Medications Ordered in ED ?Medications  ?diphenhydrAMINE (BENADRYL) injection 25 mg (has no administration in time range)  ?morphine (PF) 4 MG/ML injection 4 mg (4 mg Intravenous Given 02/27/22 1511)  ?alum & mag hydroxide-simeth (MAALOX/MYLANTA) 200-200-20 MG/5ML suspension 30 mL (30 mLs Oral Given 02/27/22 1502)  ?  And  ?lidocaine (XYLOCAINE) 2 % viscous mouth solution 15 mL (15 mLs Oral Given 02/27/22 1502)  ?dicyclomine (BENTYL) capsule 10 mg (10 mg Oral Given 02/27/22 1510)  ?ondansetron [facility]) injection 4 mg (4 mg Intravenous Given 02/27/22 1508)  ? ? ?ED Course/ Medical Decision Making/ A&P ?  ?                        ?Medical Decision Making ?Amount and/or Complexity of Data Reviewed ?Labs: ordered. ?Radiology: ordered. ? ?Risk ?OTC drugs. ?Prescription drug  management. ? ? ?This patient presents to the ED for concern of severe epigastric abdominal pain, nausea, vomiting, this involves an extensive number of treatment options, and is a complaint that carries with it a high risk of complications and morbidity. The emergent differential diagnosis prior to evaluation includes, but is not limited to, pancreatitis, cholecystitis, cholangitis, choledocholithiasis, acute mesenteric ischemia, gastritis, gastroenteritis, marijuana hyperemesis syndrome, less clinical concern for thoracic aorta dissection as patient is normotensive, no history of heart disease, and overall young age.   ? ?  This is not an exhaustive differential.  ? ?Past Medical History / Co-morbidities / Social History: ?Previous history of abdominal cramping, anemia, anxiety, acid reflux, migraines, previous hernia repair ? ?Additional history: ?External records from outside source obtained and reviewed including previous imaging and lab work from emergency department visits over the last few years.  Outpatient family medicine, urgent care visits. ? ?Physical Exam: ?Physical exam performed. The pertinent findings include: Very focal tenderness in the epigastric region, no significant tenderness throughout the rest of the abdomen.  No rebound, rigidity, guarding.  Normal bowel sounds throughout.  Patient is otherwise overall well-appearing, nontoxic-appearing. ? ?Lab Tests: ?I ordered, and personally interpreted labs.  The pertinent results include: CMP overall unremarkable, mild hypoalbuminemia.  Urinalysis unremarkable.  Patient not pregnant.  Lipase normal.  CBC reassuring, patient with slight platelet elevation, possible acute phase reactant.  Hemoglobin at 9.4 is stable compared to patient's baseline. ?  ?Imaging Studies: ?I ordered imaging studies including CT abdomen pelvis with contrast.  We are pending evaluation and read at this time.  Patient will require interpretation of this imaging study after  handoff. ?  ?Medications: ?I ordered medication including morphine, bentyl, zofran, maalox/mylanta, lidocaine for abdominal.  She will require reevaluation. ? ?I discussed this case with my attending physician Dr. Denton Lank who cosigned th

## 2022-02-27 NOTE — ED Notes (Signed)
Pt verbalized understanding of d/c instructions, meds, and followup care. Denies questions. VSS, no distress noted. Steady gait to exit with all belongings.  ?

## 2022-02-27 NOTE — ED Provider Notes (Signed)
Care handoff from Ivy, PA-C at shift change. Please see their note for further information. ? ?Briefly: Patient presents today with 1 day of abdominal pain with nausea and vomiting.  Pain rated 10/10 and radiating to the back.  Some history of abdominal pain in the past however this episode seems to be worse than normal. Does endorse significant marijuana use. ? ?Ddx: pancreatitis, cholecystitis, cholangitis, choledocholithiasis, acute mesenteric ischemia, gastritis, gastroenteritis, marijuana hyperemesis syndrome ? ?Plan: Labs overall benign, anemia unchanged from baseline.  CT abdomen pelvis with contrast ordered and pending at shift change.  If normal patient can be discharged.  She has been given morphine, Bentyl, Zofran, and GI cocktail for management of her symptoms.  Will reevaluate after CT results. ? ?CT reveals ? ?1. A few prominent fluid-filled loops of small bowel, which can be seen in the setting of enteritis. ?2. Scattered colonic diverticulosis without evidence of acute ?diverticulitis. ? ?Upon reassessment of patient she states that she is feeling some better following pain medications.  She states that it has been several hours since her last episode of vomiting.  She is also no longer nauseous.  She is therefore stable for discharge at this time.  She denies any recent antibiotic use.  Will therefore treat with Augmentin for enteritis and bentyl and Zofran for pain and nausea.  Discussed findings with patient, she is understanding and amenable with plan.  Gated on red flag symptoms or prompt immediate return.  Discharged stable condition. ?  ?Vear Clock ?02/27/22 1934 ? ?  ?Ernie Avena, MD ?02/28/22 0144 ? ?

## 2022-02-27 NOTE — Discharge Instructions (Addendum)
As we discussed, CT imaging of your abdomen showed that you have inflammation of a few loops of your intestine.  This is known as enteritis.  Treatment of this is with Augmentin which is an antibiotic.  Please fill this prescription and take entire dose as prescribed for your symptoms.  I have also given you Bentyl which is a medication to help with stomach cramping and Zofran for nausea and vomiting.  Please take these as prescribed as needed.  Follow-up with your primary care doctor in the next few days for continued evaluation and management. ? ?Return if development of any new or worsening symptoms. ?

## 2022-02-27 NOTE — ED Triage Notes (Signed)
Pt. Stated, I started having stomach pain with N/V since yesterday. ?

## 2022-04-17 ENCOUNTER — Encounter: Payer: Self-pay | Admitting: *Deleted

## 2022-09-10 ENCOUNTER — Other Ambulatory Visit: Payer: Self-pay

## 2022-09-10 ENCOUNTER — Encounter (HOSPITAL_COMMUNITY): Payer: Self-pay | Admitting: *Deleted

## 2022-09-10 ENCOUNTER — Ambulatory Visit (HOSPITAL_COMMUNITY)
Admission: EM | Admit: 2022-09-10 | Discharge: 2022-09-10 | Disposition: A | Discharge status: Other Acute Inpatient Hospital | Payer: Commercial Managed Care - HMO | Attending: Family Medicine | Admitting: Family Medicine

## 2022-09-10 DIAGNOSIS — S0990XA Unspecified injury of head, initial encounter: Secondary | ICD-10-CM | POA: Insufficient documentation

## 2022-09-10 DIAGNOSIS — U071 COVID-19: Secondary | ICD-10-CM | POA: Insufficient documentation

## 2022-09-10 DIAGNOSIS — J069 Acute upper respiratory infection, unspecified: Secondary | ICD-10-CM | POA: Diagnosis not present

## 2022-09-10 MED ORDER — ONDANSETRON 4 MG PO TBDP: 4.0000 mg | ORAL_TABLET | Freq: Three times a day (TID) | ORAL | 0 refills | Status: DC | PRN
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
## 2022-09-10 NOTE — Discharge Instructions (Addendum)
Please proceed to the ER for further evaluation of the head injury   You have been swabbed for COVID, and the test will result in the next 24 hours. Our staff will call you if positive. If the test is positive, you should quarantine for 5 days from the start of your symptoms  Ondansetron dissolved in the mouth every 8 hours as needed for nausea or vomiting. Clear liquids and bland things to eat.
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
## 2022-09-11 ENCOUNTER — Telehealth (HOSPITAL_COMMUNITY): Payer: Self-pay | Admitting: Emergency Medicine

## 2022-09-11 LAB — SARS CORONAVIRUS 2 (TAT 6-24 HRS): SARS Coronavirus 2: POSITIVE — AB

## 2022-09-11 MED ORDER — NIRMATRELVIR/RITONAVIR (PAXLOVID)TABLET: 3.0000 | ORAL_TABLET | Freq: Two times a day (BID) | ORAL | 0 refills | Status: AC

## 2022-10-25 ENCOUNTER — Ambulatory Visit
Admission: RE | Admit: 2022-10-25 | Discharge: 2022-10-25 | Disposition: A | Discharge status: Home / Self Care | Payer: Commercial Managed Care - HMO | Source: Ambulatory Visit | Attending: Family Medicine | Admitting: Family Medicine

## 2022-10-25 ENCOUNTER — Other Ambulatory Visit: Payer: Self-pay | Admitting: Family Medicine

## 2022-10-25 DIAGNOSIS — R0789 Other chest pain: Secondary | ICD-10-CM

## 2022-10-25 DIAGNOSIS — R222 Localized swelling, mass and lump, trunk: Secondary | ICD-10-CM

## 2022-11-02 ENCOUNTER — Encounter (HOSPITAL_COMMUNITY): Payer: Self-pay

## 2022-11-02 ENCOUNTER — Ambulatory Visit (HOSPITAL_COMMUNITY)
Admission: EM | Admit: 2022-11-02 | Discharge: 2022-11-02 | Disposition: A | Discharge status: Home / Self Care | Payer: Commercial Managed Care - HMO | Attending: Family Medicine | Admitting: Family Medicine

## 2022-11-02 DIAGNOSIS — J069 Acute upper respiratory infection, unspecified: Secondary | ICD-10-CM | POA: Diagnosis present

## 2022-11-02 DIAGNOSIS — J029 Acute pharyngitis, unspecified: Secondary | ICD-10-CM | POA: Diagnosis present

## 2022-11-02 LAB — POCT RAPID STREP A, ED / UC: Streptococcus, Group A Screen (Direct): NEGATIVE

## 2022-11-02 MED ORDER — BENZONATATE 100 MG PO CAPS: 100.0000 mg | ORAL_CAPSULE | Freq: Three times a day (TID) | ORAL | 0 refills | Status: DC | PRN

## 2022-11-02 MED ORDER — ONDANSETRON 4 MG PO TBDP
4.0000 mg | ORAL_TABLET | Freq: Three times a day (TID) | ORAL | 0 refills | Status: DC | PRN
Start: 2022-11-02 — End: 2022-12-24

## 2022-11-02 NOTE — ED Triage Notes (Signed)
Pt states sore throat for the past 4 days.

## 2022-11-02 NOTE — ED Provider Notes (Addendum)
[place]    CSN: [id number] Arrival date & time: 11/02/22  1041      History   Chief Complaint Chief Complaint  Patient presents with   Sore Throat    HPI Rachael Moyer is a 46 y.o. female.    Sore Throat   Here for sore throat that began on December 19.  She also developed cough and congestion yesterday.  No fever or chills.  On December 18 she awoke with vomiting.  She has not thrown up since then but she has remained nauseated.  No diarrhea so far.  Last menstrual cycle was December 6.  Past Medical History:  Diagnosis Date   Anxiety    Bunion    Gastric ulcer    GERD (gastroesophageal reflux disease)    Headache    Irritable bowel     Patient Active Problem List   Diagnosis Date Noted   GERD (gastroesophageal reflux disease) 08/30/2021   Migraine 08/30/2021   Abdominal cramping 08/14/2017   Pap smear for cervical cancer screening 08/14/2017   Generalized anxiety disorder 05/16/2017   Bunion     Past Surgical History:  Procedure Laterality Date   FOOT SURGERY     VENTRAL HERNIA REPAIR N/A 07/04/2020   Procedure: VENTRAL HERNIA REPAIR WITH MESH;  Surgeon: Coralie Keens, MD;  Location: [facility];  Service: General;  Laterality: N/A;  LMA    OB History   No obstetric history on file.      Home Medications    Prior to Admission medications   Medication Sig Start Date End Date Taking? Authorizing Provider  benzonatate (TESSALON) 100 MG capsule Take 1 capsule (100 mg total) by mouth 3 (three) times daily as needed for cough. 11/02/22  Yes Barrett Henle, MD  dicyclomine (BENTYL) 20 MG tablet Take 1 tablet (20 mg total) by mouth 2 (two) times daily. 02/27/22   Smoot, Leary Roca, PA-C  EXCEDRIN TENSION HEADACHE 500-65 MG TABS Take 1-2 tablets by mouth every 6 (six) hours as needed (for headaches).    [provider]  ibuprofen (ADVIL) 200 MG tablet Take 800 mg by mouth every 6 (six) hours as needed for mild  pain or headache.    [provider]  ondansetron (ZOFRAN-ODT) 4 MG disintegrating tablet Take 1 tablet (4 mg total) by mouth every 8 (eight) hours as needed for nausea or vomiting. 11/02/22   Windy Carina, Gwenlyn Perking, MD    Family History Family History  Adopted: Yes    Social History Social History   Tobacco Use   Smoking status: Former    Types: Cigarettes   Smokeless tobacco: Never  Vaping Use   Vaping Use: Never used  Substance Use Topics   Alcohol use: Yes    Comment: 1/month   Drug use: No     Allergies   Patient has no known allergies.   Review of Systems Review of Systems   Physical Exam Triage Vital Signs ED Triage Vitals [11/02/22 1155]  Enc Vitals Group     BP 131/84     Pulse Rate 79     Resp 16     Temp 98.2 F (36.8 C)     Temp Source Oral     SpO2 98 %     Weight      Height      Head Circumference      Peak Flow      Pain Score 8     Pain Loc  Pain Edu?      Excl. in GC?    No data found.  Updated Vital Signs BP 131/84 (BP Location: Left Arm)   Pulse 79   Temp 98.2 F (36.8 C) (Oral)   Resp 16   LMP 10/17/2022 (Approximate)   SpO2 98%   Visual Acuity Right Eye Distance:   Left Eye Distance:   Bilateral Distance:    Right Eye Near:   Left Eye Near:    Bilateral Near:     Physical Exam Vitals reviewed.  Constitutional:      General: She is not in acute distress.    Appearance: She is not toxic-appearing.  HENT:     Right Ear: Tympanic membrane and ear canal normal.     Left Ear: Tympanic membrane and ear canal normal.     Nose: Nose normal.     Mouth/Throat:     Mouth: Mucous membranes are moist.     Comments: There is erythema of the tonsils with 2+ hypertrophy.  There is clear mucus in the tonsillar crypts. Eyes:     Extraocular Movements: Extraocular movements intact.     Conjunctiva/sclera: Conjunctivae normal.     Pupils: Pupils are equal, round, and reactive to light.  Cardiovascular:     Rate and  Rhythm: Normal rate and regular rhythm.     Heart sounds: No murmur heard. Pulmonary:     Effort: Pulmonary effort is normal. No respiratory distress.     Breath sounds: No stridor. No wheezing, rhonchi or rales.  Abdominal:     Palpations: Abdomen is soft.  Musculoskeletal:     Cervical back: Neck supple.  Lymphadenopathy:     Cervical: No cervical adenopathy.  Skin:    Capillary Refill: Capillary refill takes less than 2 seconds.     Coloration: Skin is not jaundiced or pale.  Neurological:     General: No focal deficit present.     Mental Status: She is alert and oriented to person, place, and time.  Psychiatric:        Behavior: Behavior normal.      UC Treatments / Results  Labs (all labs ordered are listed, but only abnormal results are displayed) Labs Reviewed  CULTURE, GROUP A STREP ([facility])  SARS CORONAVIRUS 2 (TAT 6-24 HRS)  POCT RAPID STREP A, ED / UC    EKG   Radiology No results found.  Procedures Procedures (including critical care time)  Medications Ordered in UC Medications - No data to display  Initial Impression / Assessment and Plan / UC Course  I have reviewed the triage vital signs and the nursing notes.  Pertinent labs & imaging results that were available during my care of the patient were reviewed by me and considered in my medical decision making (see chart for details).        Rapid strep is negative, so throat culture is sent.  We will treat per protocol if positive.   She states she just got over COVID about 2-3 weeks ago, so we will not test for that; order to be    Zofran is sent in for her nausea and Tessalon Perles for her cough. Final Clinical Impressions(s) / UC Diagnoses   Final diagnoses:  Acute pharyngitis, unspecified etiology  Viral URI with cough     Discharge Instructions      Your strep test is negative.  Culture of the throat will be sent, and staff will notify you if that is in turn positive.  Take  benzonatate 100 mg, 1 tab every 8 hours as needed for cough.  Ondansetron dissolved in the mouth every 8 hours as needed for nausea or vomiting. Clear liquids and bland things to eat.   You have been swabbed for COVID, and the test will result in the next 24 hours. Our staff will call you if positive. If the COVID test is positive, you should quarantine for 5 days from the start of your symptoms      ED Prescriptions     Medication Sig Dispense Auth. Provider   ondansetron (ZOFRAN-ODT) 4 MG disintegrating tablet Take 1 tablet (4 mg total) by mouth every 8 (eight) hours as needed for nausea or vomiting. 10 tablet Barrett Henle, MD   benzonatate (TESSALON) 100 MG capsule Take 1 capsule (100 mg total) by mouth 3 (three) times daily as needed for cough. 21 capsule Barrett Henle, MD      PDMP not reviewed this encounter.   Barrett Henle, MD 11/02/22 1304    Barrett Henle, MD 11/02/22 [id number]

## 2022-11-02 NOTE — Discharge Instructions (Addendum)
Your strep test is negative.  Culture of the throat will be sent, and staff will notify you if that is in turn positive.  Take benzonatate 100 mg, 1 tab every 8 hours as needed for cough.  Ondansetron dissolved in the mouth every 8 hours as needed for nausea or vomiting. Clear liquids and bland things to eat.

## 2022-11-05 LAB — CULTURE, GROUP A STREP (THRC)

## 2022-12-13 ENCOUNTER — Other Ambulatory Visit: Payer: Self-pay | Admitting: Family Medicine

## 2022-12-13 DIAGNOSIS — G4486 Cervicogenic headache: Secondary | ICD-10-CM

## 2022-12-17 ENCOUNTER — Other Ambulatory Visit: Payer: Self-pay

## 2022-12-17 ENCOUNTER — Emergency Department (HOSPITAL_COMMUNITY): Payer: Medicaid Other

## 2022-12-17 ENCOUNTER — Emergency Department (HOSPITAL_COMMUNITY)
Admission: EM | Admit: 2022-12-17 | Discharge: 2022-12-17 | Disposition: A | Discharge status: Home / Self Care | Payer: Medicaid Other | Attending: Emergency Medicine | Admitting: Emergency Medicine

## 2022-12-17 ENCOUNTER — Encounter (HOSPITAL_COMMUNITY): Payer: Self-pay | Admitting: Radiology

## 2022-12-17 DIAGNOSIS — Z1152 Encounter for screening for COVID-19: Secondary | ICD-10-CM | POA: Insufficient documentation

## 2022-12-17 DIAGNOSIS — R55 Syncope and collapse: Secondary | ICD-10-CM | POA: Insufficient documentation

## 2022-12-17 DIAGNOSIS — M5481 Occipital neuralgia: Secondary | ICD-10-CM | POA: Diagnosis not present

## 2022-12-17 DIAGNOSIS — R519 Headache, unspecified: Secondary | ICD-10-CM | POA: Diagnosis present

## 2022-12-17 LAB — URINALYSIS, ROUTINE W REFLEX MICROSCOPIC
Bilirubin Urine: NEGATIVE
Glucose, UA: NEGATIVE mg/dL
Hgb urine dipstick: NEGATIVE
Ketones, ur: NEGATIVE mg/dL
Leukocytes,Ua: NEGATIVE
Nitrite: NEGATIVE
Protein, ur: NEGATIVE mg/dL
Specific Gravity, Urine: 1.019 (ref 1.005–1.030)
pH: 6 (ref 5.0–8.0)

## 2022-12-17 LAB — BASIC METABOLIC PANEL
Anion gap: 6 (ref 5–15)
BUN: 11 mg/dL (ref 6–20)
CO2: 24 mmol/L (ref 22–32)
Calcium: 8.5 mg/dL — ABNORMAL LOW (ref 8.9–10.3)
Chloride: 108 mmol/L (ref 98–111)
Creatinine, Ser: 0.82 mg/dL (ref 0.44–1.00)
GFR, Estimated: >60 mL/min (ref >60)
Glucose, Bld: 87 mg/dL (ref 70–99)
Potassium: 3.7 mmol/L (ref 3.5–5.1)
Sodium: 138 mmol/L (ref 135–145)

## 2022-12-17 LAB — CBC
HCT: 32.7 % — ABNORMAL LOW (ref 36.0–46.0)
Hemoglobin: 9.8 g/dL — ABNORMAL LOW (ref 12.0–15.0)
MCH: 25.5 pg — ABNORMAL LOW (ref 26.0–34.0)
MCHC: 30 g/dL (ref 30.0–36.0)
MCV: 84.9 fL (ref 80.0–100.0)
Platelets: 517 10*3/uL — ABNORMAL HIGH (ref 150–400)
RBC: 3.85 MIL/uL — ABNORMAL LOW (ref 3.87–5.11)
RDW: 14.9 % (ref 11.5–15.5)
WBC: 13.3 10*3/uL — ABNORMAL HIGH (ref 4.0–10.5)
nRBC: 0 % (ref 0.0–0.2)

## 2022-12-17 LAB — HEPATIC FUNCTION PANEL
ALT: 10 U/L (ref 0–44)
AST: 13 U/L — ABNORMAL LOW (ref 15–41)
Albumin: 3.6 g/dL (ref 3.5–5.0)
Alkaline Phosphatase: 66 U/L (ref 38–126)
Bilirubin, Direct: <0.1 mg/dL (ref 0.0–0.2)
Total Bilirubin: 0.7 mg/dL (ref 0.3–1.2)
Total Protein: 7.2 g/dL (ref 6.5–8.1)

## 2022-12-17 LAB — RESP PANEL BY RT-PCR (RSV, FLU A&B, COVID)  RVPGX2
Influenza A by PCR: NEGATIVE
Influenza B by PCR: NEGATIVE
Resp Syncytial Virus by PCR: NEGATIVE
SARS Coronavirus 2 by RT PCR: NEGATIVE

## 2022-12-17 LAB — TROPONIN I (HIGH SENSITIVITY)
Troponin I (High Sensitivity): <2 ng/L (ref <18)
Troponin I (High Sensitivity): <2 ng/L (ref <18)

## 2022-12-17 LAB — CBG MONITORING, ED: Glucose-Capillary: 76 mg/dL (ref 70–99)

## 2022-12-17 LAB — I-STAT BETA HCG BLOOD, ED (MC, WL, AP ONLY): I-stat hCG, quantitative: <5 m[IU]/mL (ref <5)

## 2022-12-17 MED ORDER — GADOBUTROL 1 MMOL/ML IV SOLN
10.0000 mL | Freq: Once | INTRAVENOUS | Status: AC | PRN
  Administered 2022-12-17: 10 mL via INTRAVENOUS

## 2022-12-17 MED ORDER — LACTATED RINGERS IV BOLUS
1000.0000 mL | Freq: Once | INTRAVENOUS | Status: AC
  Administered 2022-12-17: 1000 mL via INTRAVENOUS

## 2022-12-17 MED ORDER — KETOROLAC TROMETHAMINE 15 MG/ML IJ SOLN
15.0000 mg | Freq: Once | INTRAMUSCULAR | Status: AC
  Administered 2022-12-17: 15 mg via INTRAVENOUS
  Filled 2022-12-17: qty 1

## 2022-12-17 MED ORDER — LIDOCAINE 5 % EX PTCH
1.0000 | MEDICATED_PATCH | Freq: Once | CUTANEOUS | Status: DC
  Administered 2022-12-17: 1 via TRANSDERMAL
  Filled 2022-12-17: qty 1

## 2022-12-17 MED ORDER — METHOCARBAMOL 500 MG PO TABS: 500.0000 mg | ORAL_TABLET | Freq: Two times a day (BID) | ORAL | 0 refills | Status: DC

## 2022-12-17 MED ORDER — DEXAMETHASONE SODIUM PHOSPHATE 10 MG/ML IJ SOLN
10.0000 mg | Freq: Once | INTRAMUSCULAR | Status: AC
  Administered 2022-12-17: 10 mg via INTRAVENOUS
  Filled 2022-12-17: qty 1

## 2022-12-17 MED ORDER — METOCLOPRAMIDE HCL 5 MG/ML IJ SOLN
10.0000 mg | Freq: Once | INTRAMUSCULAR | Status: AC
  Administered 2022-12-17: 10 mg via INTRAVENOUS
  Filled 2022-12-17: qty 2

## 2022-12-17 NOTE — Discharge Instructions (Addendum)
You were seen in the ER for evaluation of your headaches. Your MRI was normal. Your lab work showed a mildly elevated white blood count, otherwise normally. I am placing the information for [facility] for you to call to schedule an appointment with. The headache specialists are listed as well. I would like for you to take the Midol with Acetaminophen 500mg . Additionally, I am going to send you home with a muscle relaxer to take at night. Please do not drive or operate heavy machinery while on this medication as it can make you sleepy. If you have any concerns, new or worsening symptoms, please return to the ER for re-evaluation.   Contact a health care provider if: Medicine does not help your symptoms. You have a headache that is different from your usual headache. You have nausea or you vomit. You have a fever. Get help right away if: Your headache: Becomes severe quickly. Gets worse after moderate to intense physical activity. You have any of these symptoms: Repeated vomiting. Pain or stiffness in your neck. Changes to your vision. Pain in an eye or ear. Problems with speech. Muscular weakness or loss of muscle control. Loss of balance or coordination. You feel faint or pass out. You have confusion. You have a seizure. These symptoms may represent a serious problem that is an emergency. Do not wait to see if the symptoms will go away. Get medical help right away. Call your local emergency services (911 in the [place] Do not drive yourself to the hospital.

## 2022-12-17 NOTE — ED Provider Notes (Signed)
[facility] Provider Note   CSN: [id number] Arrival date & time: 12/17/22  1349     History Chief Complaint  Patient presents with   Migraine   Loss of Consciousness    Rachael Moyer is a 47 y.o. female with history of migraines, GERD, hyperlipidemia presents the emergency room today for evaluation of a headache that is been present constantly for the past 4 days and gradually worsening.  Patient reports that she has had migraines throughout her life started in the age of 52.  She reports that she gets bad migraines every few years like the 1 she has today.  She reports that she had 1 similarly a few years back.  She usually takes Excedrin for her migraines which helps.  She reports that she has been on Excedrin now without much relief of symptoms.  She reports that she has been on multiple occasions for migraines such as Ubrelvy, Topamax being the latest one.  She reports that she went to her primary care doctor when this for started and she was placed on Topamax which she has been taking reports no relief of symptoms.  She reports that today she was having continuation of pain still and was sitting at her computer doing work when she felt lightheaded and passed out.  She did not urinate on herself.  She has had more lower posterior head pain but still has full range of motion.  She denies any fever, runny nose, nasal congestion.  She reports some photophobia as well as some blurry vision in both of her eyes but denies any phonophobia or recent fevers.  No chest pain or shortness of breath.  She denies any trouble walking or trouble talking.  No room spinning sensation.  She does report that she did have some lightheadedness.  Her MRI with her PCP is scheduled in March for the fourth.  She reports that she is also headache now however does not have any lightheadedness.    Migraine Associated symptoms include headaches. Pertinent negatives include no  chest pain, no abdominal pain and no shortness of breath.  Loss of Consciousness Associated symptoms: headaches   Associated symptoms: no chest pain, no dizziness, no fever, no nausea, no shortness of breath, no vomiting and no weakness        Home Medications Prior to Admission medications   Medication Sig Start Date End Date Taking? Authorizing Provider  benzonatate (TESSALON) 100 MG capsule Take 1 capsule (100 mg total) by mouth 3 (three) times daily as needed for cough. 11/02/22   Barrett Henle, MD  dicyclomine (BENTYL) 20 MG tablet Take 1 tablet (20 mg total) by mouth 2 (two) times daily. 02/27/22   Smoot, Leary Roca, PA-C  EXCEDRIN TENSION HEADACHE 500-65 MG TABS Take 1-2 tablets by mouth every 6 (six) hours as needed (for headaches).    [provider]  ibuprofen (ADVIL) 200 MG tablet Take 800 mg by mouth every 6 (six) hours as needed for mild pain or headache.    [provider]  ondansetron (ZOFRAN-ODT) 4 MG disintegrating tablet Take 1 tablet (4 mg total) by mouth every 8 (eight) hours as needed for nausea or vomiting. 11/02/22   Barrett Henle, MD      Allergies    Patient has no known allergies.    Review of Systems   Review of Systems  Constitutional:  Negative for chills and fever.  HENT:  Negative for congestion and rhinorrhea.  Eyes:  Positive for photophobia and visual disturbance. Negative for pain.  Respiratory:  Negative for shortness of breath.   Cardiovascular:  Positive for syncope. Negative for chest pain.  Gastrointestinal:  Negative for abdominal pain, constipation, diarrhea, nausea and vomiting.  Musculoskeletal:  Negative for neck pain.  Neurological:  Positive for syncope, light-headedness and headaches. Negative for dizziness, speech difficulty, weakness and numbness.    Physical Exam Updated Vital Signs BP 130/86 (BP Location: Left Arm)   Pulse 62   Temp 97.7 F (36.5 C) (Oral)   Resp 18   LMP 12/16/2022   SpO2 100%   Physical Exam Vitals and nursing note reviewed.  Constitutional:      Appearance: Normal appearance. She is not toxic-appearing.     Comments: Uncomfortable, but nontoxic-appearing  HENT:     Head: Normocephalic and atraumatic.      Comments: Tenderness to the marked area.  No fluctuance or induration.  No lymphadenopathy.  No increased erythema or warmth.  No swelling noted.    Right Ear: Tympanic membrane, ear canal and external ear normal.     Left Ear: Tympanic membrane, ear canal and external ear normal.     Mouth/Throat:     Mouth: Mucous membranes are moist.  Eyes:     General: No scleral icterus.    Extraocular Movements: Extraocular movements intact.     Pupils: Pupils are equal, round, and reactive to light.  Cardiovascular:     Rate and Rhythm: Normal rate and regular rhythm.     Pulses:          Radial pulses are 2+ on the right side and 2+ on the left side.       Dorsalis pedis pulses are 2+ on the right side and 2+ on the left side.       Posterior tibial pulses are 2+ on the right side and 2+ on the left side.  Pulmonary:     Effort: Pulmonary effort is normal. No respiratory distress.     Breath sounds: Normal breath sounds.  Abdominal:     General: Bowel sounds are normal.     Palpations: Abdomen is soft.     Tenderness: There is no abdominal tenderness. There is no guarding or rebound.  Musculoskeletal:        General: No deformity.     Cervical back: Normal range of motion and neck supple. No rigidity or tenderness.  Skin:    General: Skin is warm and dry.  Neurological:     General: No focal deficit present.     Mental Status: She is alert. Mental status is at baseline.     GCS: GCS eye subscore is 4. GCS verbal subscore is 5. GCS motor subscore is 6.     Cranial Nerves: No cranial nerve deficit, dysarthria or facial asymmetry.     Sensory: No sensory deficit.     Motor: No weakness or pronator drift.     Comments: GCS 15.  Patient alert.  Cranial  nerves II through XII intact.  She is answering questions appropriately with appropriate speech.  No facial asymmetry noted.  Sensation intact throughout.  Patient's has 5 and 5 strength in upper and lower extremities.  No pronator drift.     ED Results / Procedures / Treatments   Labs (all labs ordered are listed, but only abnormal results are displayed) Labs Reviewed  BASIC METABOLIC PANEL - Abnormal; Notable for the following components:      Result  Value   Calcium 8.5 (*)    All other components within normal limits  CBC - Abnormal; Notable for the following components:   WBC 13.3 (*)    RBC 3.85 (*)    Hemoglobin 9.8 (*)    HCT 32.7 (*)    MCH 25.5 (*)    Platelets 517 (*)    All other components within normal limits  HEPATIC FUNCTION PANEL - Abnormal; Notable for the following components:   AST 13 (*)    All other components within normal limits  RESP PANEL BY RT-PCR (RSV, FLU A&B, COVID)  RVPGX2  URINALYSIS, ROUTINE W REFLEX MICROSCOPIC  CBG MONITORING, ED  I-STAT BETA HCG BLOOD, ED ([facility], [facility], [facility] ONLY)  TROPONIN I (HIGH SENSITIVITY)    EKG EKG Interpretation  Date/Time:  Monday December 17 2022 14:28:36 EST Ventricular Rate:  64 PR Interval:  182 QRS Duration: 92 QT Interval:  402 QTC Calculation: 415 R Axis:   105 Text Interpretation: Right and left arm electrode reversal, interpretation assumes no reversal Sinus rhythm Probable lateral infarct, age indeterminate similar to prior Confirmed by Wynona Dove ([id number]) on 12/17/2022 3:44:01 PM  Radiology MR Brain W and Wo Contrast  Result Date: 12/17/2022 CLINICAL DATA:  Headache.  Worsening frequency or severity EXAM: MRI HEAD WITHOUT AND WITH CONTRAST TECHNIQUE: Multiplanar, multiecho pulse sequences of the brain and surrounding structures were obtained without and with intravenous contrast. CONTRAST:  69m GADAVIST GADOBUTROL 1 MMOL/ML IV SOLN COMPARISON:  Head CT same day FINDINGS: Brain: The brain has a normal appearance  without evidence of malformation, atrophy, old or acute small or large vessel infarction, mass lesion, hemorrhage, hydrocephalus or extra-axial collection. After contrast administration, no abnormal enhancement occurs. Vascular: Major vessels at the base of the brain show flow. Venous sinuses appear patent. Skull and upper cervical spine: Normal. Sinuses/Orbits: Clear/normal. Other: None significant. IMPRESSION: Normal examination. No abnormality seen to explain headache. Electronically Signed   By: MNelson ChimesM.D.   On: 12/17/2022 21:38   CT Cervical Spine Wo Contrast  Result Date: 12/17/2022 CLINICAL DATA:  Head and neck trauma. Moderate-to-severe, syncope episode EXAM: CT HEAD WITHOUT CONTRAST CT CERVICAL SPINE WITHOUT CONTRAST TECHNIQUE: Multidetector CT imaging of the head and cervical spine was performed following the standard protocol without intravenous contrast. Multiplanar CT image reconstructions of the cervical spine were also generated. RADIATION DOSE REDUCTION: This exam was performed according to the departmental dose-optimization program which includes automated exposure control, adjustment of the mA and/or kV according to patient size and/or use of iterative reconstruction technique. COMPARISON:  None Available. FINDINGS: CT HEAD FINDINGS Brain: No evidence of acute infarction, hemorrhage, hydrocephalus, extra-axial collection or mass lesion/mass effect. Vascular: No hyperdense vessel or unexpected calcification. Skull: Normal. Negative for fracture or focal lesion. Sinuses/Orbits: No acute finding. Other: None. CT CERVICAL SPINE FINDINGS Alignment: Straightening of the cervical spine. Skull base and vertebrae: No acute fracture. No primary bone lesion or focal pathologic process. Bone island in the C7 vertebral body. Soft tissues and spinal canal: No prevertebral fluid or swelling. No visible canal hematoma. Disc levels: Disc heights are maintained. No significant disc bulge, spinal canal or  neural foraminal stenosis. Upper chest: Negative. Other: None IMPRESSION: CT HEAD: No acute intracranial abnormality. CT CERVICAL SPINE: 1. No acute fracture or traumatic subluxation. 2. Straightening of the cervical spine, which may be due to positioning or muscle spasm. Electronically Signed   By: IKeane PoliceD.O.   On: 12/17/2022 15:21   CT Head  Wo Contrast  Result Date: 12/17/2022 CLINICAL DATA:  Head and neck trauma. Moderate-to-severe, syncope episode EXAM: CT HEAD WITHOUT CONTRAST CT CERVICAL SPINE WITHOUT CONTRAST TECHNIQUE: Multidetector CT imaging of the head and cervical spine was performed following the standard protocol without intravenous contrast. Multiplanar CT image reconstructions of the cervical spine were also generated. RADIATION DOSE REDUCTION: This exam was performed according to the departmental dose-optimization program which includes automated exposure control, adjustment of the mA and/or kV according to patient size and/or use of iterative reconstruction technique. COMPARISON:  None Available. FINDINGS: CT HEAD FINDINGS Brain: No evidence of acute infarction, hemorrhage, hydrocephalus, extra-axial collection or mass lesion/mass effect. Vascular: No hyperdense vessel or unexpected calcification. Skull: Normal. Negative for fracture or focal lesion. Sinuses/Orbits: No acute finding. Other: None. CT CERVICAL SPINE FINDINGS Alignment: Straightening of the cervical spine. Skull base and vertebrae: No acute fracture. No primary bone lesion or focal pathologic process. Bone island in the C7 vertebral body. Soft tissues and spinal canal: No prevertebral fluid or swelling. No visible canal hematoma. Disc levels: Disc heights are maintained. No significant disc bulge, spinal canal or neural foraminal stenosis. Upper chest: Negative. Other: None IMPRESSION: CT HEAD: No acute intracranial abnormality. CT CERVICAL SPINE: 1. No acute fracture or traumatic subluxation. 2. Straightening of the  cervical spine, which may be due to positioning or muscle spasm. Electronically Signed   By: Keane Police D.O.   On: 12/17/2022 15:21     Procedures Procedures   Medications Ordered in ED Medications  ketorolac (TORADOL) 15 MG/ML injection 15 mg (has no administration in time range)  dexamethasone (DECADRON) injection 10 mg (has no administration in time range)  metoCLOPramide (REGLAN) injection 10 mg (has no administration in time range)  lactated ringers bolus 1,000 mL (has no administration in time range)  lidocaine (LIDODERM) 5 % 1 patch (has no administration in time range)    ED Course/ Medical Decision Making/ A&P                           Medical Decision Making Amount and/or Complexity of Data Reviewed Labs: ordered. Radiology: ordered.  Risk Prescription drug management.   47 year old female presents emergency room today for evaluation of her headache and syncopal episode.  Differential diagnosis includes was not limited to Intracranial hemorrhage, meningitis, CVA, intracranial tumor, venous sinus thrombosis, migraine, cluster headache, hypertension, drug related, pseudotumor cerebri, AVM, head injury, tension headache, sinusitis, dental abscess, otitis media, TMJ, depression, CVA, ACS, arrhythmia, vasovagal syncope, orthostatic hypotension, sepsis, hypoglycemia, electrolyte disturbance, respiratory failure, symptomatic anemia, dehydration, heat injury, polypharmacy, malignancy, anxiety/panic attack.  Vital signs are unremarkable.  Patient normotensive, afebrile, pulse rate, satting well on room air without increased work of breathing.  Physical exam as noted above.  I independently reviewed and interpreted the patient's labs.  Urinalysis unremarkable.  CBC does show slight increase in white blood cell count at 13.3.  Platelets also mildly elevated at 517 although patient appears to have elevated platelets in her past.  She is anemic with a hemoglobin of 9.8 which is actually  slightly increased from her baseline.  BMP shows mildly decreased calcium at 8.5, mildly decreased AST at 13 otherwise no electrolyte or LFT abnormality.  hCG is negative.  She is negative for COVID, flu, RSV.  Her troponins are less than 2.  CBG within normal limits.  CT of the head and cervical spine show CT HEAD: No acute intracranial abnormality. CT CERVICAL SPINE: 1.  No acute fracture or traumatic subluxation. 2. Straightening of the cervical spine, which may be due to positioning or muscle spasm.  Patient was given migraine cocktail including Decadron, Reglan, and Toradol.  Patient reports that her pain is improved from a 7 to a 4 after the migraine cocktail with some fluids.  I contacted neurology and spoke with Dr. Malen Gauze who recommended doing an MRI with and without of the brain.  He also recommends following up with a headache specialist.  He recommends valproic acid 500 mg IV once if she still continues to have pain.  MRI ordered and shows IMPRESSION: Normal examination. No abnormality seen to explain headache.  EKG read as Right and left arm electrode reversal, interpretation assumes no reversal Sinus rhythm Probable lateral infarct, age indeterminate similar to prior.  With the reassuring EKG and troponins, less likely any ACS.  Patient is PERC negative and low Wells criteria less likely PE.  80 and syncopal score less than 2 points putting her at a very low risk.MRI does not show any signs of stroke or intracranial tumor.  Not really consistent with sinusitis.  No signs of dental abscess or otitis media.  No head injury.  Syncopal episode could be from pain or from dehydration.  No meningeal signs, doubt any meningitis.  Doubt any brain bleed or hemorrhage given reassuring CTs and MRIs and that patient improved after migraine cocktail.  On previous chart evaluation, patient was seen in 2018 and was diagnosed with occipital neuralgia on the right as well.  Patient confirms that this is  the last time she had a headache that was this bad and it felt very similar to that.  She however did not follow-up with a neurologist as she was instructed to do.  I offered her some valproic acid that was recommended by the neurologist.  Patient reports that she would just like to go home and rest and does not think that she needs any more medication as her headache is now manageable.  I think this is reasonable.  I stressed to her that she needs to follow with a neurologist and gave her the headache specialist that Dr. Malen Gauze mentioned.  We discussed strict return precautions and red flag symptoms.  Patient verbalized her understanding and agrees to the plan.  Patient is stable being discharged home in good condition.  Final Clinical Impression(s) / ED Diagnoses Final diagnoses:  Occipital neuralgia of right side  Syncope, unspecified syncope type    Rx / DC Orders ED Discharge Orders          Ordered    methocarbamol (ROBAXIN) 500 MG tablet  2 times daily        12/17/22 2312              Sherrell Puller, [place] 12/21/22 [id number]    Jeanell Sparrow, DO 12/21/22 1704

## 2022-12-17 NOTE — ED Notes (Signed)
PA-C Riley at the bedside, pt requesting something for her headache, provider aware.

## 2022-12-17 NOTE — ED Triage Notes (Signed)
Pt states she's been having a posterior headache since last Thursday. States she has a hx of migraines, but this is different from her usual pain. Today at work pt reports that she was sitting and felt dizzy, leading to syncopal episode where she awoke on the floor. Pt denies blood thinner use.

## 2022-12-17 NOTE — ED Notes (Signed)
Pt ambulated to BR w/o assist, steady gait noted 

## 2022-12-17 NOTE — ED Notes (Signed)
Pt has return from MRI, NAD noted, pt alert

## 2022-12-17 NOTE — ED Notes (Signed)
Pt to MRI at this time via wheelchair with radiology tech

## 2022-12-20 NOTE — Progress Notes (Signed)
Referring:  Sandria Bales, FNP [place],  [place] [id number]  PCP: Sandria Bales, FNP  Neurology was asked to evaluate Rachael Moyer, a 47 year old female for a chief complaint of headaches.  Our recommendations of care will be communicated by shared medical record.    CC:  headaches  History provided from self, husband  HPI:  Medical co-morbidities: HLD, migraines  The patient presents for evaluation of headaches which began about 1 week ago.  Headaches are described as stabbing pain which starts in her occiput and neck and radiates forward. The pain is constant. It is associated with photophobia, phonophobia, nausea, and lightheadedness. She denies radicular pain, numbness, or weakness in the upper extremities. Her PCP prescribed her a 5 day course of prednisone, which did not relieve her symptoms. She is currently not taking anything for her headaches.  She had similar headaches in 2018. States she had a shot in the back of her head at that time, which did not help. These headaches eventually resolved on their own.  She also has migraine headaches and states these feel different. Her migraines typically have more severe photophobia.  MRI brain 12/17/22 was unremarkable  Headache History: Onset: 1 week ago Triggers: activity Aura: blurry vision Location: occiput Quality/Description: Associated Symptoms:  Photophobia: yes  Phonophobia: yes  Nausea: yes Worse with activity?: yes Duration of headaches: constant  Headache days per month: 30 Headache free days per month: 0  Current Treatment: Abortive none  Preventative none  Prior Therapies                                 Rescue: Excedrin Robaxin 500 mg PRN Zofran 4 mg PRN Imitrex 25 mg PRN - lack of efficacy Ubrelvy - lack of efficacy  Prevention: Topamax 25 mg QHS - lack of efficacy  LABS: CBC    Component Value Date/Time   WBC 13.3 (H) 12/17/2022 1416   RBC 3.85 (L)  12/17/2022 1416   HGB 9.8 (L) 12/17/2022 1416   HGB 9.4 (L) 08/29/2021 1708   HCT 32.7 (L) 12/17/2022 1416   HCT 29.5 (L) 08/29/2021 1708   PLT 517 (H) 12/17/2022 1416   PLT 443 08/29/2021 1708   MCV 84.9 12/17/2022 1416   MCV 82 08/29/2021 1708   MCH 25.5 (L) 12/17/2022 1416   MCHC 30.0 12/17/2022 1416   RDW 14.9 12/17/2022 1416   RDW 14.6 08/29/2021 1708   LYMPHSABS 3.8 08/11/2016 1301   MONOABS 0.5 08/11/2016 1301   EOSABS 0.1 08/11/2016 1301   BASOSABS 0.0 08/11/2016 1301      Latest Ref Rng & Units 12/17/2022    2:16 PM 02/27/2022    9:05 AM 08/29/2021    5:08 PM  CMP  Glucose 70 - 99 mg/dL 87  98  77   BUN 6 - 20 mg/dL 11  6  6   $ Creatinine 0.44 - 1.00 mg/dL 0.82  0.85  0.59   Sodium 135 - 145 mmol/L 138  142  141   Potassium 3.5 - 5.1 mmol/L 3.7  3.6  3.9   Chloride 98 - 111 mmol/L 108  111  104   CO2 22 - 32 mmol/L 24  25  24   $ Calcium 8.9 - 10.3 mg/dL 8.5  8.9  8.8   Total Protein 6.5 - 8.1 g/dL 7.2  6.7    Total Bilirubin 0.3 - 1.2 mg/dL  0.7  0.8    Alkaline Phos 38 - 126 U/L 66  63    AST 15 - 41 U/L 13  14    ALT 0 - 44 U/L 10  10       IMAGING:  MRI brain 12/17/22: unremarkable  CT C-spine 12/17/22: 1. No acute fracture or traumatic subluxation. 2. Straightening of the cervical spine, which may be due to positioning or muscle spasm.   Imaging independently reviewed on December 24, 2022   Current Outpatient Medications on File Prior to Visit  Medication Sig Dispense Refill   atorvastatin (LIPITOR) 40 MG tablet Take 40 mg by mouth at bedtime.     EXCEDRIN TENSION HEADACHE 500-65 MG TABS Take 1-2 tablets by mouth every 6 (six) hours as needed (for headaches).     pantoprazole (PROTONIX) 40 MG tablet Take 40 mg by mouth daily.     No current facility-administered medications on file prior to visit.     Allergies: No Known Allergies  Family History: She was adopted and does not know family history  Past Medical History: Past Medical History:   Diagnosis Date   Anxiety    Bunion    Gastric ulcer    GERD (gastroesophageal reflux disease)    Headache    Irritable bowel     Past Surgical History Past Surgical History:  Procedure Laterality Date   FOOT SURGERY     VENTRAL HERNIA REPAIR N/A 07/04/2020   Procedure: VENTRAL HERNIA REPAIR WITH MESH;  Surgeon: Coralie Keens, MD;  Location: [facility];  Service: General;  Laterality: N/A;  LMA    Social History: Social History   Tobacco Use   Smoking status: Former    Types: Cigarettes   Smokeless tobacco: Never  Vaping Use   Vaping Use: Never used  Substance Use Topics   Alcohol use: Not Currently    Comment: 1/month   Drug use: Yes    ROS: Negative for fevers, chills. Positive for headaches. All other systems reviewed and negative unless stated otherwise in HPI.   Physical Exam:   Vital Signs: BP 130/89   Pulse (!) 110   Ht 5' 3"$  (1.6 m)   Wt 214 lb (97.1 kg)   LMP 12/16/2022   BMI 37.91 kg/m  GENERAL: well appearing,in no acute distress,alert SKIN:  Color, texture, turgor normal. No rashes or lesions HEAD:  Normocephalic/atraumatic. CV:  RRR RESP: Normal respiratory effort MSK: +tenderness to palpation over bilateral occiput, neck, and shoulders  NEUROLOGICAL: Mental Status: Alert, oriented to person, place and time,Follows commands Cranial Nerves: PERRL, visual fields intact to confrontation, extraocular movements intact, facial sensation intact, no facial droop or ptosis, hearing grossly intact, no dysarthria Motor: muscle strength 5/5 both upper and lower extremities,no drift, normal tone Reflexes: 2+ throughout Sensation: intact to light touch all 4 extremities Coordination: Finger-to- nose-finger intact bilaterally Gait: normal-based   IMPRESSION: 47 year old female with a history of HLD who presents for evaluation of headaches which began 1 week ago. Exam with bilateral occipital tenderness, suggestive of occipital  neuralgia. Suspect this is also triggering migraines as she reports associated photophobia, phonophobia, and nausea. Will send in 5 day course of Toradol to break her current migraine, and will nortriptyline daily for headache prevention.  PLAN: -Toradol 10 mg TID x5 days to break current headache -Prevention: Start nortriptyline 10 mg QHS x1 week, the increase to 20 mg QHS -Rescue: Continue Robaxin 500 mg PRN, increase Zofran to 8 mg PRN  for nausea -Next steps: consider Maxalt for rescue, neck PT  I spent a total of 31 minutes chart reviewing and counseling the patient. Headache education was done. Discussed treatment options including preventive and acute medications. Discussed medication side effects, adverse reactions and drug interactions. Written educational materials and patient instructions outlining all of the above were given.  Follow-up: 6 months   Genia Harold, MD 12/24/2022   2:57 PM

## 2022-12-24 ENCOUNTER — Ambulatory Visit (INDEPENDENT_AMBULATORY_CARE_PROVIDER_SITE_OTHER): Payer: Commercial Managed Care - HMO | Admitting: Psychiatry

## 2022-12-24 ENCOUNTER — Encounter: Payer: Self-pay | Admitting: Psychiatry

## 2022-12-24 VITALS — BP 130/89 | HR 110 | Ht 63.0 in | Wt 214.0 lb

## 2022-12-24 DIAGNOSIS — M5481 Occipital neuralgia: Secondary | ICD-10-CM | POA: Diagnosis not present

## 2022-12-24 DIAGNOSIS — G43001 Migraine without aura, not intractable, with status migrainosus: Secondary | ICD-10-CM | POA: Diagnosis not present

## 2022-12-24 MED ORDER — METHOCARBAMOL 500 MG PO TABS: 500.0000 mg | ORAL_TABLET | Freq: Three times a day (TID) | ORAL | 6 refills | Status: DC | PRN

## 2022-12-24 MED ORDER — KETOROLAC TROMETHAMINE 10 MG PO TABS: 10.0000 mg | ORAL_TABLET | Freq: Three times a day (TID) | ORAL | 0 refills | Status: AC

## 2022-12-24 MED ORDER — NORTRIPTYLINE HCL 10 MG PO CAPS: ORAL_CAPSULE | ORAL | 6 refills | Status: AC

## 2022-12-24 MED ORDER — ONDANSETRON 8 MG PO TBDP: 8.0000 mg | ORAL_TABLET | Freq: Three times a day (TID) | ORAL | 6 refills | Status: AC | PRN

## 2022-12-24 NOTE — Patient Instructions (Signed)
Plan: -Take Toradol 3 times a day for the next 5 days to help break your headache. Please avoid taking other NSAIDs (ibuprofen, Aleve, Excedrin) while taking Toradol -Start nortriptyline at bedtime for headache prevention. Take 1 pill at bedtime for one week, then increase to 2 pills at bedtime -You can take methocarbamol (muscle relaxer) up to 3 times a day as needed for neck pain/muscle spasms -Take ondansetron up to 3 times a day as needed for nausea/vomiting

## 2023-01-08 ENCOUNTER — Encounter: Payer: Self-pay | Admitting: Family Medicine

## 2023-01-10 ENCOUNTER — Encounter: Payer: Self-pay | Admitting: Family Medicine

## 2023-01-14 ENCOUNTER — Other Ambulatory Visit: Payer: Commercial Managed Care - HMO

## 2023-07-31 ENCOUNTER — Ambulatory Visit: Payer: Commercial Managed Care - HMO | Admitting: Psychiatry

## 2023-07-31 ENCOUNTER — Encounter: Payer: Self-pay | Admitting: Psychiatry

## 2023-07-31 NOTE — Progress Notes (Deleted)
   CC:  headaches  Follow-up Visit  Last visit: 12/24/22  Brief HPI: 47 year old female with a history of HLD  who follows in clinic for occipital neuralgia and migraines. MRI brain 12/17/22 was unremarkable.  At her last visit she was started on nortriptyline for headache prevention. Robaxin was continued for rescue. Interval History: ***   Headache days per month: *** Migraine days per month*** Headache free days per month: ***  Current Headache Regimen: Preventative: *** Abortive: ***   Prior Therapies                                  Rescue: Excedrin Robaxin 500 mg PRN Zofran 4 mg PRN Imitrex 25 mg PRN - lack of efficacy Ubrelvy - lack of efficacy   Prevention: Topamax 25 mg QHS - lack of efficacy  Physical Exam:   Vital Signs: There were no vitals taken for this visit. GENERAL:  well appearing, in no acute distress, alert  SKIN:  Color, texture, turgor normal. No rashes or lesions HEAD:  Normocephalic/atraumatic. RESP: normal respiratory effort MSK:  No gross joint deformities.   NEUROLOGICAL: Mental Status: Alert, oriented to person, place and time, Follows commands, and Speech fluent and appropriate. Cranial Nerves: PERRL, face symmetric, no dysarthria, hearing grossly intact Motor: moves all extremities equally Gait: normal-based.  IMPRESSION: ***  PLAN: ***   Follow-up: ***  I spent a total of *** minutes on the date of the service. Headache education was done. Discussed lifestyle modification including increased oral hydration, decreased caffeine, exercise and stress management. Discussed treatment options including preventive and acute medications, natural supplements, and infusion therapy. Discussed medication overuse headache and to limit use of acute treatments to no more than 2 days/week or 10 days/month. Discussed medication side effects, adverse reactions and drug interactions. Written educational materials and patient instructions outlining  all of the above were given.  Ocie Doyne, MD

## 2023-08-23 ENCOUNTER — Encounter (HOSPITAL_COMMUNITY): Payer: Self-pay | Admitting: Emergency Medicine

## 2023-08-23 ENCOUNTER — Emergency Department (HOSPITAL_COMMUNITY): Payer: Commercial Managed Care - HMO

## 2023-08-23 ENCOUNTER — Other Ambulatory Visit: Payer: Self-pay

## 2023-08-23 ENCOUNTER — Emergency Department (HOSPITAL_COMMUNITY)
Admission: EM | Admit: 2023-08-23 | Discharge: 2023-08-23 | Disposition: A | Discharge status: Home / Self Care | Payer: Commercial Managed Care - HMO | Attending: Emergency Medicine | Admitting: Emergency Medicine

## 2023-08-23 DIAGNOSIS — M545 Low back pain, unspecified: Secondary | ICD-10-CM | POA: Diagnosis not present

## 2023-08-23 DIAGNOSIS — R1030 Lower abdominal pain, unspecified: Secondary | ICD-10-CM | POA: Diagnosis present

## 2023-08-23 LAB — CBC WITH DIFFERENTIAL/PLATELET
Abs Immature Granulocytes: 0.03 10*3/uL (ref 0.00–0.07)
Basophils Absolute: 0.1 10*3/uL (ref 0.0–0.1)
Basophils Relative: 1 %
Eosinophils Absolute: 0.1 10*3/uL (ref 0.0–0.5)
Eosinophils Relative: 1 %
HCT: 28.4 % — ABNORMAL LOW (ref 36.0–46.0)
Hemoglobin: 7.9 g/dL — ABNORMAL LOW (ref 12.0–15.0)
Immature Granulocytes: 0 %
Lymphocytes Relative: 37 %
Lymphs Abs: 3 10*3/uL (ref 0.7–4.0)
MCH: 22 pg — ABNORMAL LOW (ref 26.0–34.0)
MCHC: 27.8 g/dL — ABNORMAL LOW (ref 30.0–36.0)
MCV: 79.1 fL — ABNORMAL LOW (ref 80.0–100.0)
Monocytes Absolute: 0.5 10*3/uL (ref 0.1–1.0)
Monocytes Relative: 6 %
Neutro Abs: 4.4 10*3/uL (ref 1.7–7.7)
Neutrophils Relative %: 55 %
Platelets: 431 10*3/uL — ABNORMAL HIGH (ref 150–400)
RBC: 3.59 MIL/uL — ABNORMAL LOW (ref 3.87–5.11)
RDW: 15.7 % — ABNORMAL HIGH (ref 11.5–15.5)
WBC: 8.1 10*3/uL (ref 4.0–10.5)
nRBC: 0 % (ref 0.0–0.2)

## 2023-08-23 LAB — URINALYSIS, W/ REFLEX TO CULTURE (INFECTION SUSPECTED)
Bacteria, UA: NONE SEEN
Bilirubin Urine: NEGATIVE
Glucose, UA: NEGATIVE mg/dL
Hgb urine dipstick: NEGATIVE
Ketones, ur: NEGATIVE mg/dL
Leukocytes,Ua: NEGATIVE
Nitrite: NEGATIVE
Protein, ur: NEGATIVE mg/dL
Specific Gravity, Urine: 1.01 (ref 1.005–1.030)
pH: 6 (ref 5.0–8.0)

## 2023-08-23 LAB — COMPREHENSIVE METABOLIC PANEL
ALT: 14 U/L (ref 0–44)
AST: 16 U/L (ref 15–41)
Albumin: 3.4 g/dL — ABNORMAL LOW (ref 3.5–5.0)
Alkaline Phosphatase: 67 U/L (ref 38–126)
Anion gap: 10 (ref 5–15)
BUN: 6 mg/dL (ref 6–20)
CO2: 24 mmol/L (ref 22–32)
Calcium: 9.3 mg/dL (ref 8.9–10.3)
Chloride: 104 mmol/L (ref 98–111)
Creatinine, Ser: 0.97 mg/dL (ref 0.44–1.00)
GFR, Estimated: >60 mL/min (ref >60)
Glucose, Bld: 98 mg/dL (ref 70–99)
Potassium: 3.8 mmol/L (ref 3.5–5.1)
Sodium: 138 mmol/L (ref 135–145)
Total Bilirubin: 0.7 mg/dL (ref 0.3–1.2)
Total Protein: 6.9 g/dL (ref 6.5–8.1)

## 2023-08-23 LAB — LIPASE, BLOOD: Lipase: 28 U/L (ref 11–51)

## 2023-08-23 LAB — HCG, SERUM, QUALITATIVE: Preg, Serum: NEGATIVE

## 2023-08-23 MED ORDER — DICYCLOMINE HCL 10 MG PO CAPS
10.0000 mg | ORAL_CAPSULE | Freq: Once | ORAL | Status: AC
  Administered 2023-08-23: 10 mg via ORAL
  Filled 2023-08-23: qty 1

## 2023-08-23 MED ORDER — DICYCLOMINE HCL 20 MG PO TABS: 20.0000 mg | ORAL_TABLET | Freq: Two times a day (BID) | ORAL | 0 refills | Status: DC

## 2023-08-23 MED ORDER — FENTANYL CITRATE PF 50 MCG/ML IJ SOSY
50.0000 ug | PREFILLED_SYRINGE | Freq: Once | INTRAMUSCULAR | Status: AC
  Administered 2023-08-23: 50 ug via INTRAVENOUS
  Filled 2023-08-23: qty 1

## 2023-08-23 MED ORDER — ONDANSETRON HCL 4 MG/2ML IJ SOLN
4.0000 mg | Freq: Once | INTRAMUSCULAR | Status: AC
  Administered 2023-08-23: 4 mg via INTRAVENOUS
  Filled 2023-08-23: qty 2

## 2023-08-23 MED ORDER — DICYCLOMINE HCL 10 MG/ML IM SOLN: 20.0000 mg | Freq: Once | INTRAMUSCULAR | Status: DC

## 2023-08-23 MED ORDER — MORPHINE SULFATE (PF) 2 MG/ML IV SOLN: 2.0000 mg | Freq: Once | INTRAVENOUS | Status: DC

## 2023-08-23 MED ORDER — IOHEXOL 350 MG/ML SOLN
75.0000 mL | Freq: Once | INTRAVENOUS | Status: AC | PRN
  Administered 2023-08-23: 75 mL via INTRAVENOUS

## 2023-08-23 MED ORDER — MORPHINE SULFATE (PF) 4 MG/ML IV SOLN: 4.0000 mg | Freq: Once | INTRAVENOUS | Status: DC

## 2023-08-23 NOTE — ED Provider Notes (Signed)
Signout received on this 47 year old female who presented with suprapubic abdominal pain.  Please see previous note for full details.  At the time of signout she is waiting CT abdomen pelvis results. Physical Exam  BP 128/76 (BP Location: Right Arm)   Pulse 68   Temp 97.8 F (36.6 C) (Oral)   Resp 16   Ht 5\' 3"  (1.6 m)   Wt 97 kg   SpO2 98%   BMI 37.88 kg/m    Procedures  Procedures  ED Course / MDM    Medical Decision Making Amount and/or Complexity of Data Reviewed Labs: ordered. Radiology: ordered.  Risk Prescription drug management.   CT without acute intra-abdominal or pelvic findings.  It shows ovarian cysts.  She states she was not aware of the cyst previously.  Will order pelvic ultrasound given she has constant cramping pain as well as intermittent severe pain. Ultrasound with evidence of uterine fibroids.  No other concerning findings.  No evidence of torsion.  Prescribe Bentyl.  Discussed Tylenol.  She will follow-up with her PCP regarding the anemia.  She refused having a Hemoccult performed in the emergency department.  She states she is no to be anemic but has not been this low before.  She is hemodynamically stable.  She denies melanotic stools, hematochezia, or hematemesis.       Marita Kansas, PA-C 08/23/23 2132    Lonell Grandchild, MD 08/24/23 1257

## 2023-08-23 NOTE — ED Triage Notes (Signed)
Pt reports 3 days of nausea/ vomiting, generalized abdominal pain. Pt with hx of hernia. No active distress noted.

## 2023-08-23 NOTE — ED Provider Triage Note (Signed)
Emergency Medicine Provider Triage Evaluation Note  Rachael Moyer , a 47 y.o. female  was evaluated in triage.  Pt complains of abdominal pain cramping, vomiting and nausea for the last 10 days but worse in the last 3 days.  Also has not had menses and is 9 days late but took home pregnancy test which was negative.  No fever, vaginal discharge, vaginal bleeding.  Does have some frequency denies dysuria.  Prior hernia repair but no other abdominal surgeries.  Review of Systems  Positive: Abdominal pain and vomiting Negative: Dysuria, cough congestion or fever  Physical Exam  BP 127/67 (BP Location: Left Arm)   Pulse 74   Temp 98.3 F (36.8 C)   Resp 16   SpO2 100%  Gen:   Awake, no distress   Resp:  Normal effort  MSK:   Moves extremities without difficulty  Other:  Diffuse abdominal pain most localized in the lower quadrants with rebound and guarding  Medical Decision Making  Medically screening exam initiated at 10:41 AM.  Appropriate orders placed.  Marbella Donahoe was informed that the remainder of the evaluation will be completed by another provider, this initial triage assessment does not replace that evaluation, and the importance of remaining in the ED until their evaluation is complete.     Gwyneth Sprout, MD 08/23/23 1043

## 2023-08-23 NOTE — ED Provider Notes (Addendum)
[place] AT [facility] Provider Note   CSN: [id number] Arrival date & time: 08/23/23  1011     History  No chief complaint on file.   Rachael Moyer is a 47 y.o. female with a history of GERD and irritable bowel presents the ED today for abdominal pain.  Patient reports that for the last 3 days she has been having persistent lower abdominal pain with sharp pains that shoot to her lower back.  She endorses associated nausea and vomiting. Patient thought that the pain was because she was suppose to get her period 08/13/23, but she still has not started. Denies fever, dysuria, or diarrhea. Denies blood in her vomit or stools, no chronic NSAID use. No other concerns or complaints at this time.   Home Medications Prior to Admission medications   Medication Sig Start Date End Date Taking? Authorizing Provider  atorvastatin (LIPITOR) 40 MG tablet Take 40 mg by mouth at bedtime.    [provider]  EXCEDRIN TENSION HEADACHE 500-65 MG TABS Take 1-2 tablets by mouth every 6 (six) hours as needed (for headaches).    [provider]  methocarbamol (ROBAXIN) 500 MG tablet Take 1 tablet (500 mg total) by mouth every 8 (eight) hours as needed for muscle spasms. 12/24/22   Ocie Doyne, MD  nortriptyline (PAMELOR) 10 MG capsule Take 1 pill at bedtime for 1 week, then increase to 2 pills at bedtime 12/24/22   Ocie Doyne, MD  ondansetron (ZOFRAN-ODT) 8 MG disintegrating tablet Take 1 tablet (8 mg total) by mouth every 8 (eight) hours as needed for nausea or vomiting. 12/24/22   Ocie Doyne, MD  pantoprazole (PROTONIX) 40 MG tablet Take 40 mg by mouth daily.    [provider]      Allergies    Patient has no known allergies.    Review of Systems   Review of Systems  Gastrointestinal:  Positive for abdominal pain.  All other systems reviewed and are negative.   Physical Exam Updated Vital Signs BP 127/67 (BP Location: Left  Arm)   Pulse 74   Temp 98.3 F (36.8 C)   Resp 16   Ht 5\' 3"  (1.6 m)   Wt 97 kg   SpO2 100%   BMI 37.88 kg/m  Physical Exam Vitals and nursing note reviewed.  Constitutional:      Appearance: Normal appearance.  HENT:     Head: Normocephalic and atraumatic.     Mouth/Throat:     Mouth: Mucous membranes are moist.  Eyes:     Conjunctiva/sclera: Conjunctivae normal.     Pupils: Pupils are equal, round, and reactive to light.  Cardiovascular:     Rate and Rhythm: Normal rate and regular rhythm.     Pulses: Normal pulses.     Heart sounds: Normal heart sounds.  Pulmonary:     Effort: Pulmonary effort is normal.     Breath sounds: Normal breath sounds.  Abdominal:     General: There is no distension.     Palpations: Abdomen is soft.     Tenderness: There is abdominal tenderness. There is no guarding or rebound.     Comments: Suprapubic tenderness   Musculoskeletal:        General: Tenderness present.     Comments: Tenderness across the lower back  Skin:    General: Skin is warm and dry.     Findings: No rash.  Neurological:     General: No focal deficit present.  Mental Status: She is alert.  Psychiatric:        Mood and Affect: Mood normal.        Behavior: Behavior normal.     ED Results / Procedures / Treatments   Labs (all labs ordered are listed, but only abnormal results are displayed) Labs Reviewed  CBC WITH DIFFERENTIAL/PLATELET - Abnormal; Notable for the following components:      Result Value   RBC 3.59 (*)    Hemoglobin 7.9 (*)    HCT 28.4 (*)    MCV 79.1 (*)    MCH 22.0 (*)    MCHC 27.8 (*)    RDW 15.7 (*)    Platelets 431 (*)    All other components within normal limits  COMPREHENSIVE METABOLIC PANEL - Abnormal; Notable for the following components:   Albumin 3.4 (*)    All other components within normal limits  LIPASE, BLOOD  URINALYSIS, W/ REFLEX TO CULTURE (INFECTION SUSPECTED)  HCG, SERUM, QUALITATIVE    EKG None  Radiology No  results found.  Procedures Procedures    Medications Ordered in ED Medications - No data to display  ED Course/ Medical Decision Making/ A&P                                 Medical Decision Making Amount and/or Complexity of Data Reviewed Labs: ordered.  Risk Prescription drug management.   This patient presents to the ED for concern of abdominal pain, this involves an extensive number of treatment options, and is a complaint that carries with it a high risk of complications and morbidity.   Differential diagnosis includes: UTI, pyelonephritis, IUP, nephrolithiasis, urolithiasis, diverticulitis, IBD, IBS, constipation, gastroenteritis, ovarian cyst, etc.   Comorbidities  See HPI above   Additional History  Additional history obtained from previous records.   Lab Tests  I ordered and personally interpreted labs.  The pertinent results include:   Hemoglobin of 7.9, dropped from 9.8 in 2/24.  Denies FOBT today, stating she had 1 in May with her PCP and it was normal.   Imaging Studies  I ordered imaging studies including CT abdomen/pelvis - pending at shift change. I agree with the radiologist interpretation   Problem List / ED Course / Critical Interventions / Medication Management  Lower abdominal pain radiating to back I ordered medications including: Fentanyl and Zofran for pain and nausea Reevaluation of the patient after these medicines showed that the Zofran improved the nausea but fentanyl did not help.  I then ordered Bentyl. I have reviewed the patients home medicines and have made adjustments as needed   Social Determinants of Health  Access to healthcare   Test / Admission - Considered  Patient's disposition pending at shift change. Care taken over by oncoming PA.       Final Clinical Impression(s) / ED Diagnoses Final diagnoses:  None    Rx / DC Orders ED Discharge Orders     None         Maxwell Marion, PA-C 08/23/23  1538    Maxwell Marion, PA-C 08/23/23 1539    Linwood Dibbles, MD 08/23/23 1739

## 2023-08-23 NOTE — Discharge Instructions (Signed)
Your CT scan and ultrasound not show any concerning cause of your abdominal pain.  Follow-up with your primary care provider.  Take Tylenol as you need to for pain control.  I have also sent in Bentyl into the pharmacy for you.  Your hemoglobin was low at 7.8.  This is not low enough to give you a blood transfusion but I recommend you follow-up with your primary care provider.  We discussed doing a rectal exam to check for microscopic bleeding but you state you recently had this done.  Should also talk to your primary care doctor about iron supplement given you have iron deficiency anemia.  For any concerning symptoms return to the emergency room.

## 2023-08-23 NOTE — ED Notes (Signed)
Patient transported to CT 

## 2023-08-23 NOTE — ED Notes (Signed)
Patient refusing rectal exam and occult blood test; ED PA, Maxwell Marion, made aware via SecureChat.

## 2024-02-07 ENCOUNTER — Ambulatory Visit (INDEPENDENT_AMBULATORY_CARE_PROVIDER_SITE_OTHER): Payer: Self-pay

## 2024-02-07 ENCOUNTER — Ambulatory Visit (HOSPITAL_COMMUNITY)
Admission: EM | Admit: 2024-02-07 | Discharge: 2024-02-07 | Disposition: A | Discharge status: Home / Self Care | Payer: Self-pay

## 2024-02-07 ENCOUNTER — Encounter (HOSPITAL_COMMUNITY): Payer: Self-pay | Admitting: *Deleted

## 2024-02-07 DIAGNOSIS — B349 Viral infection, unspecified: Secondary | ICD-10-CM

## 2024-02-07 DIAGNOSIS — R058 Other specified cough: Secondary | ICD-10-CM

## 2024-02-07 LAB — POC COVID19/FLU A&B COMBO
Covid Antigen, POC: NEGATIVE
Influenza A Antigen, POC: NEGATIVE
Influenza B Antigen, POC: NEGATIVE

## 2024-02-07 MED ORDER — IBUPROFEN 800 MG PO TABS: 800.0000 mg | ORAL_TABLET | Freq: Three times a day (TID) | ORAL | 0 refills | Status: DC

## 2024-02-07 MED ORDER — AZELASTINE HCL 0.1 % NA SOLN: 1.0000 | Freq: Two times a day (BID) | NASAL | 1 refills | Status: DC

## 2024-02-07 NOTE — ED Triage Notes (Addendum)
 Pt states she has had cough, congestion, dizzy, back pain, body aches and chest pain from coughing since Tuesday. She has taken OTC meds theraflu and mucinex without relief. Left ear pain since yesterday

## 2024-02-07 NOTE — Discharge Instructions (Addendum)
 1. Productive cough (Primary) - EKG 12-Lead performed in UC shows normal sinus rhythm with a ventricular rate of 100 bpm, no STEMI. - DG Chest 2 View x-ray shows no acute cardiopulmonary processes, no sign of consolidation or pneumonia as a cause of symptoms.  2. Acute viral syndrome - POC Covid19/Flu A&B Antigen completed in UC is negative for COVID and influenza. - azelastine (ASTELIN) 0.1 % nasal spray; Place 1 spray into both nostrils 2 (two) times daily. Use in each nostril as directed  Dispense: 30 mL; Refill: 1 - ibuprofen (ADVIL) 800 MG tablet; Take 1 tablet (800 mg total) by mouth 3 (three) times daily.  Dispense: 21 tablet; Refill: 0

## 2024-02-07 NOTE — ED Provider Notes (Signed)
 [facility]  Note:  This document was prepared using Dragon voice recognition software and may include unintentional dictation errors.  MRN: [id number] DOB: [birth year]  Subjective:   Rillie Mckenney is a 48 y.o. female presenting for nasal congestion, body aches, headache, productive cough, chest congestion x 3 to 4 days.  Patient denies any known sick contacts.  Has been taking TheraFlu and Mucinex with minimal improvement of symptoms.  No shortness of breath, weakness, dizziness, nausea or vomiting, diarrhea.  No current facility-administered medications for this encounter.  Current Outpatient Medications:    azelastine (ASTELIN) 0.1 % nasal spray, Place 1 spray into both nostrils 2 (two) times daily. Use in each nostril as directed, Disp: 30 mL, Rfl: 1   ibuprofen (ADVIL) 800 MG tablet, Take 1 tablet (800 mg total) by mouth 3 (three) times daily., Disp: 21 tablet, Rfl: 0   atorvastatin (LIPITOR) 40 MG tablet, Take 40 mg by mouth at bedtime., Disp: , Rfl:    dicyclomine (BENTYL) 20 MG tablet, Take 1 tablet (20 mg total) by mouth 2 (two) times daily., Disp: 20 tablet, Rfl: 0   EXCEDRIN TENSION HEADACHE 500-65 MG TABS, Take 1-2 tablets by mouth every 6 (six) hours as needed (for headaches)., Disp: , Rfl:    methocarbamol (ROBAXIN) 500 MG tablet, Take 1 tablet (500 mg total) by mouth every 8 (eight) hours as needed for muscle spasms., Disp: 60 tablet, Rfl: 6   nortriptyline (PAMELOR) 10 MG capsule, Take 1 pill at bedtime for 1 week, then increase to 2 pills at bedtime, Disp: 60 capsule, Rfl: 6   ondansetron (ZOFRAN-ODT) 8 MG disintegrating tablet, Take 1 tablet (8 mg total) by mouth every 8 (eight) hours as needed for nausea or vomiting., Disp: 20 tablet, Rfl: 6   pantoprazole (PROTONIX) 40 MG tablet, Take 40 mg by mouth daily., Disp: , Rfl:    No Known Allergies  Past Medical History:  Diagnosis Date   Anxiety    Bunion    Gastric ulcer    GERD (gastroesophageal reflux  disease)    Headache    Irritable bowel      Past Surgical History:  Procedure Laterality Date   FOOT SURGERY     VENTRAL HERNIA REPAIR N/A 07/04/2020   Procedure: VENTRAL HERNIA REPAIR WITH MESH;  Surgeon: Abigail Miyamoto, MD;  Location: [facility];  Service: General;  Laterality: N/A;  LMA    Family History  Adopted: Yes    Social History   Tobacco Use   Smoking status: Former    Types: Cigarettes   Smokeless tobacco: Never  Vaping Use   Vaping status: Never Used  Substance Use Topics   Alcohol use: Not Currently    Comment: 1/month   Drug use: Yes    ROS Refer to HPI for ROS details.  Objective:   Vitals: BP 115/83 (BP Location: Right Arm)   Pulse (!) 111   Temp 98.1 F (36.7 C) (Oral)   Resp 20   LMP 01/13/2024 (Approximate)   SpO2 96%   Physical Exam Vitals and nursing note reviewed.  Constitutional:      General: She is not in acute distress.    Appearance: Normal appearance. She is well-developed. She is not ill-appearing or toxic-appearing.  HENT:     Head: Normocephalic.     Right Ear: Tympanic membrane, ear canal and external ear normal.     Left Ear: Tympanic membrane, ear canal and external ear normal.     Nose:  Congestion present. No rhinorrhea.     Mouth/Throat:     Mouth: Mucous membranes are moist.  Cardiovascular:     Rate and Rhythm: Normal rate and regular rhythm.     Heart sounds: No murmur heard. Pulmonary:     Effort: Pulmonary effort is normal. No respiratory distress.     Breath sounds: Normal breath sounds. No stridor. No wheezing, rhonchi or rales.  Chest:     Chest wall: No tenderness.  Skin:    General: Skin is warm and dry.  Neurological:     General: No focal deficit present.     Mental Status: She is alert and oriented to person, place, and time.  Psychiatric:        Mood and Affect: Mood normal.        Behavior: Behavior normal.     Procedures  Results for orders placed or performed during the  hospital encounter of 02/07/24 (from the past 24 hours)  POC Covid19/Flu A&B Antigen     Status: Normal   Collection Time: 02/07/24  1:01 PM  Result Value Ref Range   Influenza A Antigen, POC Negative    Influenza B Antigen, POC Negative    Covid Antigen, POC Negative     Assessment and Plan :   PDMP not reviewed this encounter.  1. Acute viral syndrome   2. Productive cough    1. Productive cough (Primary) - EKG 12-Lead performed in UC shows normal sinus rhythm with a ventricular rate of 100 bpm, no STEMI. - DG Chest 2 View x-ray shows no acute cardiopulmonary processes, no sign of consolidation or pneumonia as a cause of symptoms.  2. Acute viral syndrome - POC Covid19/Flu A&B Antigen completed in UC is negative for COVID and influenza. - azelastine (ASTELIN) 0.1 % nasal spray; Place 1 spray into both nostrils 2 (two) times daily. Use in each nostril as directed  Dispense: 30 mL; Refill: 1 - ibuprofen (ADVIL) 800 MG tablet; Take 1 tablet (800 mg total) by mouth 3 (three) times daily.  Dispense: 21 tablet; Refill: 0    Tonny Bollman, [place]ld[place] B, [place] 02/07/24 1402

## 2024-04-01 IMAGING — CT CT ABD-PELV W/ CM
2 of 4 series · 16 of 46 positions shown, 18 images · IV contrast (Omni 300)
Comparison: Multiple priors including most recent CT January 10, 2021

CLINICAL DATA: Acute nonlocalized abdominal pain, abdominal
cramping.

EXAM:
CT ABDOMEN AND PELVIS WITH CONTRAST
TECHNIQUE: Multidetector CT imaging of the abdomen and pelvis was performed
using the standard protocol following bolus administration of
intravenous contrast.

[Series 3: a/p w/ 5mm · axial · 0.98mm/px · z∈[-462,+8]mm · 13 of 104 slices shown, 15 images]
[im 5/104  soft-tissue]
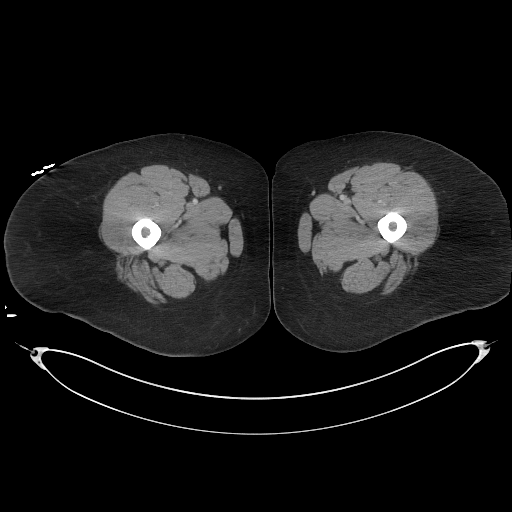
[im 5/104  bone]
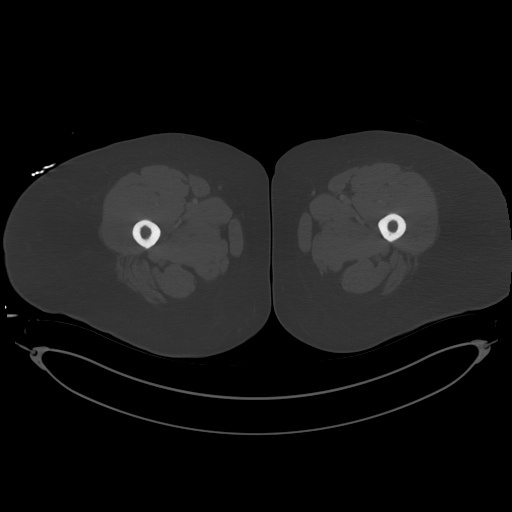
[im 13/104  soft-tissue]
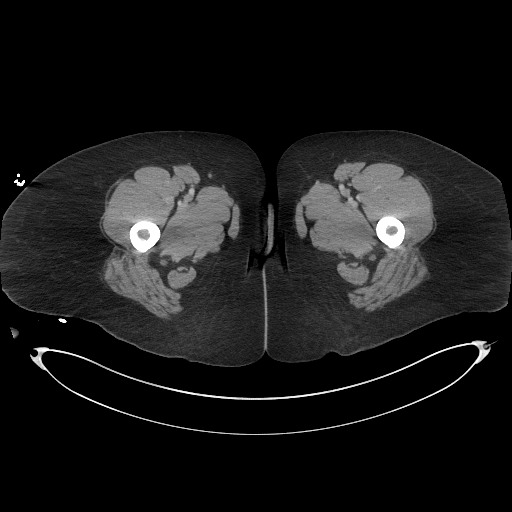
[im 22/104  soft-tissue]
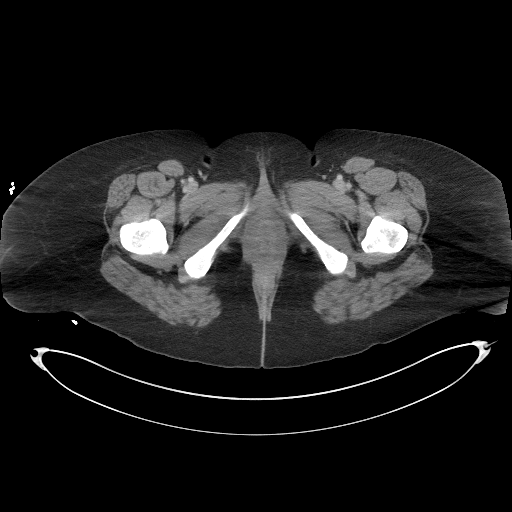
[im 31/104  soft-tissue]
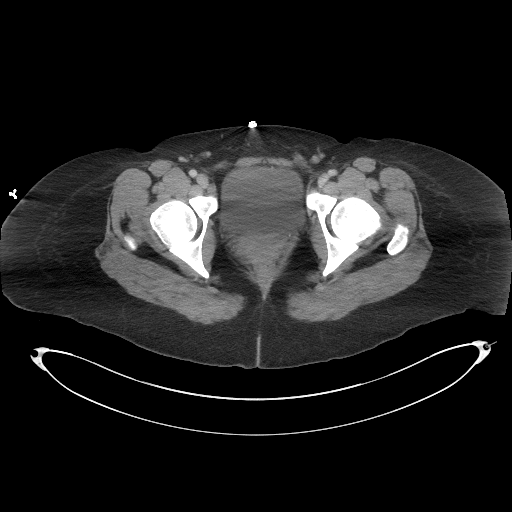
[im 35/104  soft-tissue]
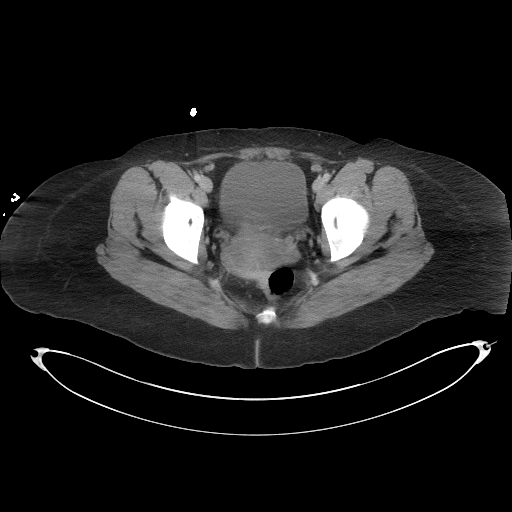
[im 43/104  soft-tissue]
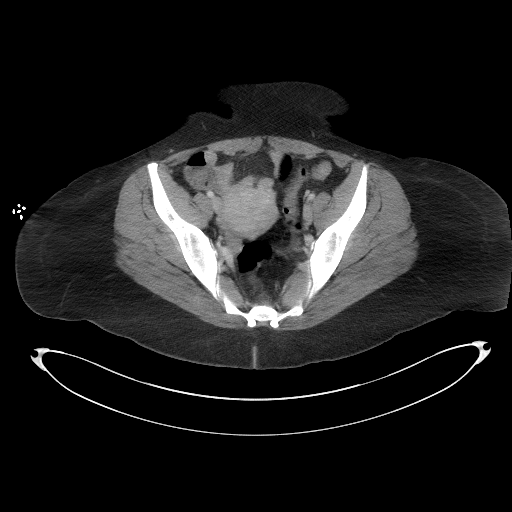
[im 52/104  soft-tissue]
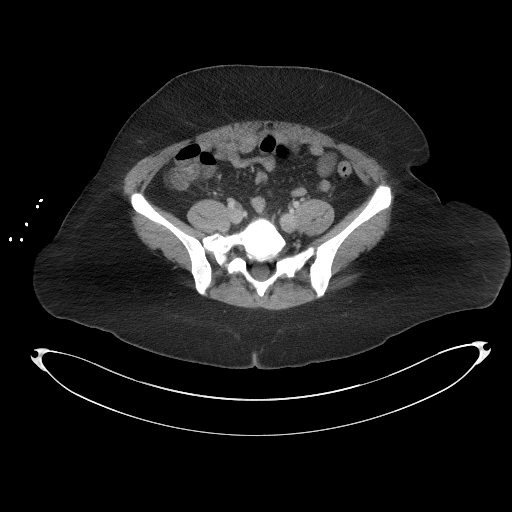
[im 61/104  soft-tissue]
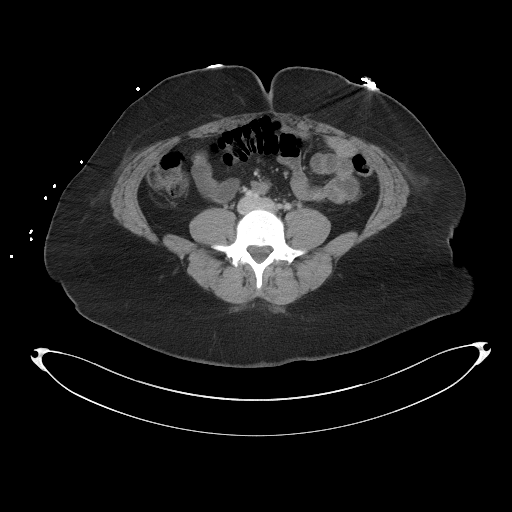
[im 69/104  soft-tissue]
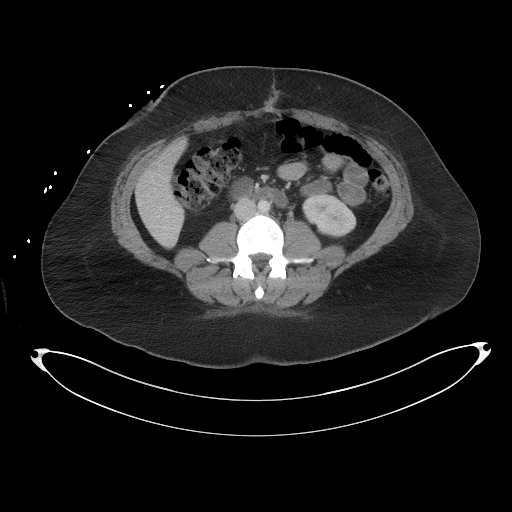
[im 69/104  bone]
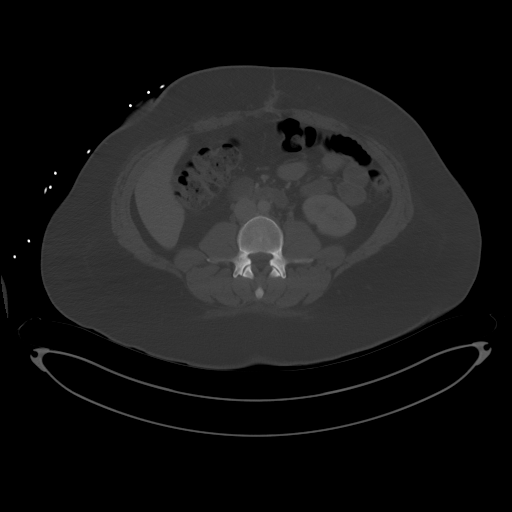
[im 73/104  soft-tissue]
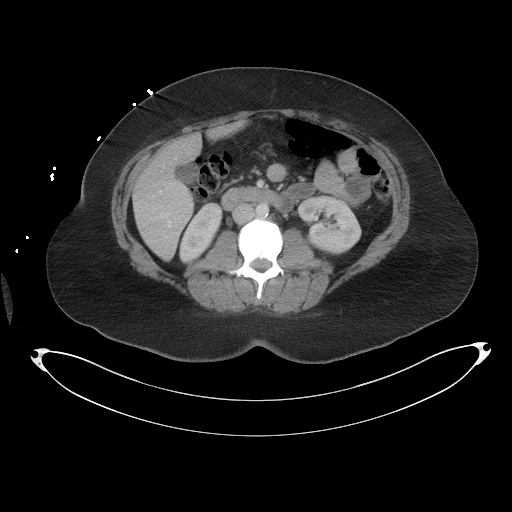
[im 82/104  soft-tissue]
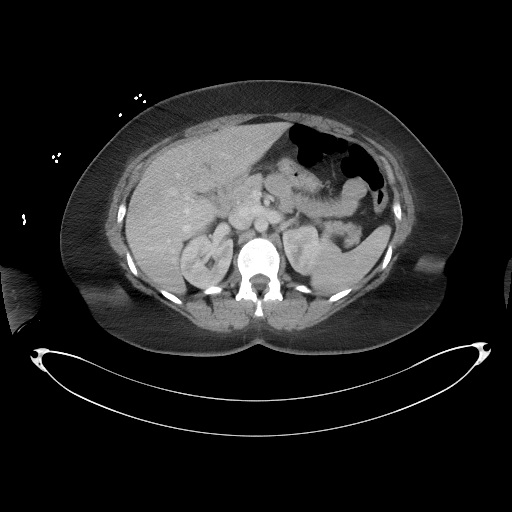
[im 91/104  soft-tissue]
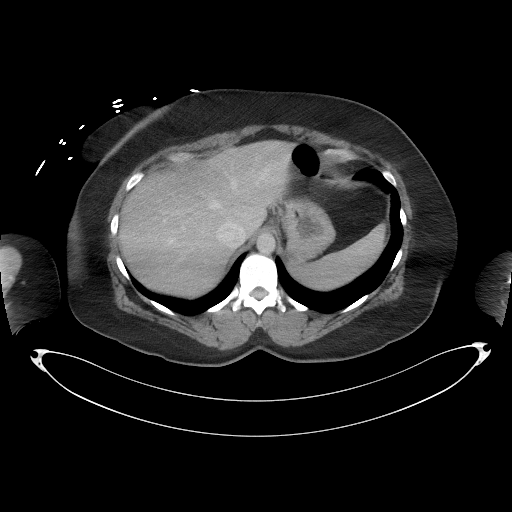
[im 99/104  soft-tissue]
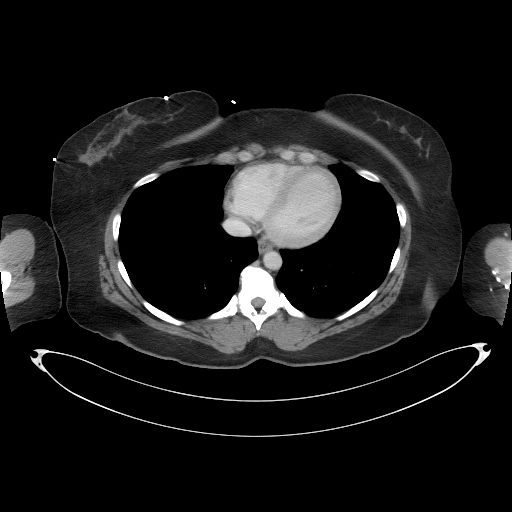

[Series 6: a/p w/ cor · coronal · 1.03mm/px · 3 of 185 slices shown]
[im 62/185  soft-tissue]
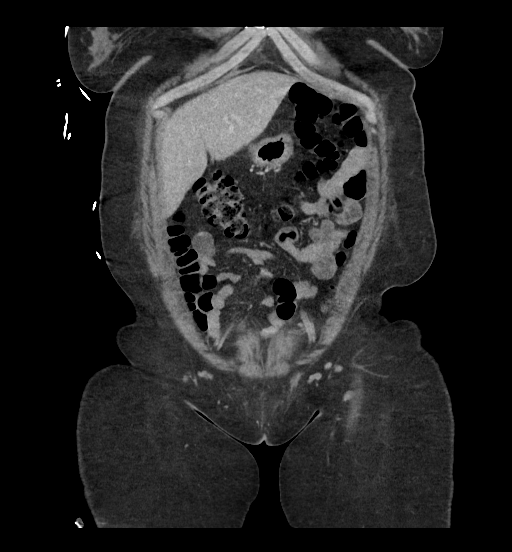
[im 82/185  soft-tissue]
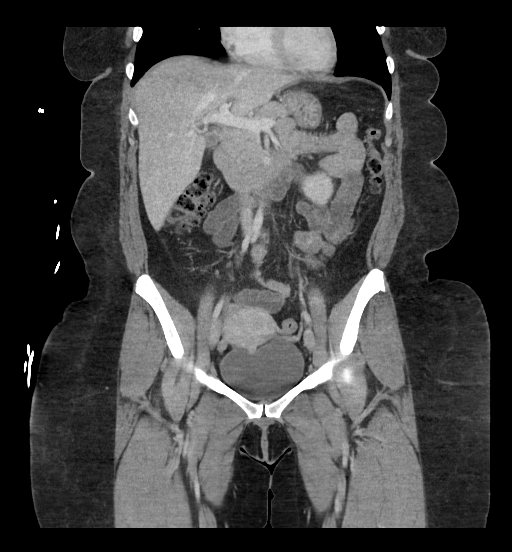
[im 103/185  soft-tissue]
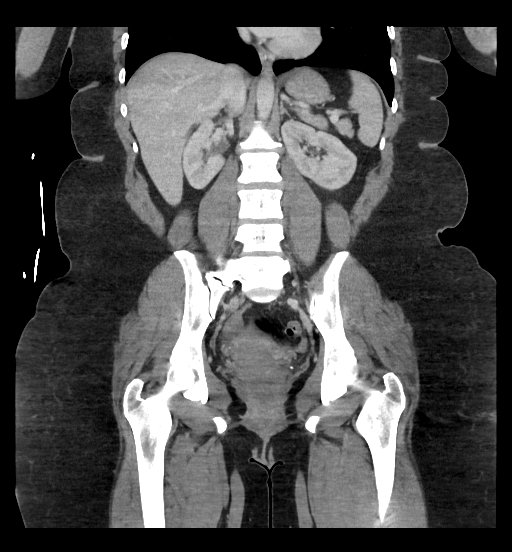

[16 of 46 positions shown; findings below may reference images not displayed]

RADIATION DOSE REDUCTION: This exam was performed according to the
departmental dose-optimization program which includes automated
exposure control, adjustment of the mA and/or kV according to
patient size and/or use of iterative reconstruction technique.

CONTRAST:  100mL OMNIPAQUE IOHEXOL 300 MG/ML  SOLN
FINDINGS: Lower chest: No acute abnormality.

Hepatobiliary: No suspicious hepatic lesion. Gallbladder is
unremarkable. No biliary ductal dilation.

Pancreas: No pancreatic ductal dilation or evidence of acute
inflammation.

Spleen: No splenomegaly or focal splenic lesion.

Adrenals/Urinary Tract: Bilateral adrenal glands appear normal. No
hydronephrosis. Stable hypodense 2.8 cm right upper pole renal cyst
which in the absence of clinically indicated signs/symptoms requires
no independent follow-up. Urinary bladder is unremarkable for degree
of distension.

Stomach/Bowel: No enteric contrast was administered. Stomach is
minimally distended limiting evaluation. No pathologic dilation of
small or large bowel. There are a few prominent fluid-filled loops
of small bowel. The appendix is not confidently identified in its
entirety however there is no pericecal inflammation. Scattered
colonic diverticulosis without evidence of acute diverticulitis.

Vascular/Lymphatic: Normal caliber abdominal aorta. No
pathologically enlarged abdominal or pelvic lymph nodes.

Reproductive: Pelvic reproductive structures are unremarkable in CT
appearance for reproductive age female.

Other: Trace pelvic free fluid is within physiologic normal limits.

Musculoskeletal: No acute or significant osseous findings.
IMPRESSION: 1. A few prominent fluid-filled loops of small bowel, which can be
seen in the setting of enteritis.
2. Scattered colonic diverticulosis without evidence of acute
diverticulitis.

## 2024-09-30 ENCOUNTER — Encounter (HOSPITAL_COMMUNITY): Payer: Self-pay

## 2024-09-30 ENCOUNTER — Ambulatory Visit (HOSPITAL_COMMUNITY): Admission: EM | Admit: 2024-09-30 | Discharge: 2024-09-30 | Disposition: A | Discharge status: Home / Self Care

## 2024-09-30 DIAGNOSIS — G44201 Tension-type headache, unspecified, intractable: Secondary | ICD-10-CM

## 2024-09-30 MED ORDER — KETOROLAC TROMETHAMINE 30 MG/ML IJ SOLN
30.0000 mg | Freq: Once | INTRAMUSCULAR | Status: AC
  Administered 2024-09-30: 30 mg via INTRAMUSCULAR

## 2024-09-30 MED ORDER — BACLOFEN 10 MG PO TABS: 10.0000 mg | ORAL_TABLET | Freq: Three times a day (TID) | ORAL | 0 refills | Status: DC

## 2024-09-30 MED ORDER — KETOROLAC TROMETHAMINE 30 MG/ML IJ SOLN
INTRAMUSCULAR | Status: AC
  Filled 2024-09-30: qty 1

## 2024-09-30 MED ORDER — MECLIZINE HCL 50 MG PO TABS: 50.0000 mg | ORAL_TABLET | Freq: Two times a day (BID) | ORAL | 0 refills | Status: DC | PRN

## 2024-09-30 MED ORDER — DICLOFENAC SODIUM 50 MG PO TBEC: 50.0000 mg | DELAYED_RELEASE_TABLET | Freq: Two times a day (BID) | ORAL | 1 refills | Status: DC

## 2024-09-30 NOTE — ED Provider Notes (Signed)
 [facility] [place]  Note:  This document was prepared using Conservation officer, historic buildings and may include unintentional dictation errors.  MRN: [id number] DOB: August 10, 1976  Subjective:   Rachael Moyer is a 48 y.o. female presenting for intractable headache with dizziness since yesterday.  Patient reports that she has history of headaches but usually headaches subside after several hours.  Patient states that symptoms began yesterday starting at her posterior neck and wrapping over the top of her head.  Patient reports today while working her vision blacked out on her for a few seconds and dizziness became worse.  Patient has taken some Excedrin Migraine with minimal improvement.  Patient reports that she did see a neurologist several years ago but has not seen anyone for follow-up since.  No current facility-administered medications for this encounter.  Current Outpatient Medications:    baclofen (LIORESAL) 10 MG tablet, Take 1 tablet (10 mg total) by mouth 3 (three) times daily., Disp: 30 each, Rfl: 0   diclofenac (VOLTAREN) 50 MG EC tablet, Take 1 tablet (50 mg total) by mouth 2 (two) times daily., Disp: 30 tablet, Rfl: 1   meclizine (ANTIVERT) 50 MG tablet, Take 1 tablet (50 mg total) by mouth 2 (two) times daily as needed for dizziness., Disp: 30 tablet, Rfl: 0   atorvastatin (LIPITOR) 40 MG tablet, Take 40 mg by mouth at bedtime., Disp: , Rfl:    azelastine  (ASTELIN ) 0.1 % nasal spray, Place 1 spray into both nostrils 2 (two) times daily. Use in each nostril as directed, Disp: 30 mL, Rfl: 1   dicyclomine  (BENTYL ) 20 MG tablet, Take 1 tablet (20 mg total) by mouth 2 (two) times daily., Disp: 20 tablet, Rfl: 0   EXCEDRIN TENSION HEADACHE 500-65 MG TABS, Take 1-2 tablets by mouth every 6 (six) hours as needed (for headaches)., Disp: , Rfl:    ibuprofen  (ADVIL ) 800 MG tablet, Take 1 tablet (800 mg total) by mouth 3 (three) times daily., Disp: 21 tablet, Rfl: 0   methocarbamol   (ROBAXIN ) 500 MG tablet, Take 1 tablet (500 mg total) by mouth every 8 (eight) hours as needed for muscle spasms., Disp: 60 tablet, Rfl: 6   nortriptyline  (PAMELOR ) 10 MG capsule, Take 1 pill at bedtime for 1 week, then increase to 2 pills at bedtime, Disp: 60 capsule, Rfl: 6   ondansetron  (ZOFRAN -ODT) 8 MG disintegrating tablet, Take 1 tablet (8 mg total) by mouth every 8 (eight) hours as needed for nausea or vomiting., Disp: 20 tablet, Rfl: 6   pantoprazole  (PROTONIX ) 40 MG tablet, Take 40 mg by mouth daily., Disp: , Rfl:    No Known Allergies  Past Medical History:  Diagnosis Date   Anxiety    Bunion    Gastric ulcer    GERD (gastroesophageal reflux disease)    Headache    Irritable bowel      Past Surgical History:  Procedure Laterality Date   FOOT SURGERY     VENTRAL HERNIA REPAIR N/A 07/04/2020   Procedure: VENTRAL HERNIA REPAIR WITH MESH;  Surgeon: Vernetta Berg, MD;  Location: [facility];  Service: General;  Laterality: N/A;  LMA    Family History  Adopted: Yes    Social History   Tobacco Use   Smoking status: Former    Types: Cigarettes   Smokeless tobacco: Never  Vaping Use   Vaping status: Never Used  Substance Use Topics   Alcohol use: Not Currently    Comment: 1/month   Drug use: Yes  ROS Refer to HPI for ROS details.  Objective:    Vitals: BP (!) 162/82 (BP Location: Left Arm)   Pulse 76   Temp 98 F (36.7 C) (Oral)   Resp 18   LMP 09/27/2024 (Approximate)   SpO2 100%   Physical Exam Vitals and nursing note reviewed.  Constitutional:      General: She is not in acute distress.    Appearance: She is well-developed. She is not ill-appearing or toxic-appearing.  HENT:     Head: Normocephalic and atraumatic.     Nose: Nose normal.     Mouth/Throat:     Mouth: Mucous membranes are moist.  Eyes:     General:        Right eye: No discharge.        Left eye: No discharge.     Extraocular Movements: Extraocular movements  intact.     Conjunctiva/sclera: Conjunctivae normal.     Pupils: Pupils are equal, round, and reactive to light.  Cardiovascular:     Rate and Rhythm: Normal rate.  Pulmonary:     Effort: Pulmonary effort is normal. No respiratory distress.  Skin:    General: Skin is warm and dry.  Neurological:     General: No focal deficit present.     Mental Status: She is alert and oriented to person, place, and time.     Cranial Nerves: No cranial nerve deficit.     Sensory: No sensory deficit.     Motor: No weakness.     Coordination: Coordination normal.     Gait: Gait normal.  Psychiatric:        Mood and Affect: Mood normal.        Behavior: Behavior normal.        Thought Content: Thought content normal.        Judgment: Judgment normal.     Procedures  No results found for this or any previous visit (from the past 24 hours).  Assessment and Plan :     Discharge Instructions       1. Acute intractable tension-type headache (Primary) - ketorolac  (TORADOL ) 30 MG/ML IM injection 30 mg given in UC for acute headache - diclofenac (VOLTAREN) 50 MG EC tablet; Take 1 tablet (50 mg total) by mouth 2 (two) times daily.  Dispense: 30 tablet; Refill: 1 - baclofen (LIORESAL) 10 MG tablet; Take 1 tablet (10 mg total) by mouth 3 (three) times daily.  Dispense: 30 each; Refill: 0 - meclizine (ANTIVERT) 50 MG tablet; Take 1 tablet (50 mg total) by mouth 2 (two) times daily as needed for dizziness.  Dispense: 30 tablet; Refill: 0 - If symptoms continue despite current therapy follow-up with neurology for further evaluation of persistent headaches.  -Continue to monitor symptoms for any change in severity if there is any escalation of current symptoms or development of new symptoms follow-up in ER for further evaluation and management.      Oniel Meleski B Rashi Giuliani   Wenda Vanschaick, [place], [place] 09/30/24 1554

## 2024-09-30 NOTE — ED Triage Notes (Signed)
 Pt c/o headache since yesterday and dizziness today. States took Excedrin yesterday with no relief.

## 2024-09-30 NOTE — Discharge Instructions (Addendum)
  1. Acute intractable tension-type headache (Primary) - ketorolac  (TORADOL ) 30 MG/ML IM injection 30 mg given in UC for acute headache - diclofenac  (VOLTAREN ) 50 MG EC tablet; Take 1 tablet (50 mg total) by mouth 2 (two) times daily.  Dispense: 30 tablet; Refill: 1 - baclofen  (LIORESAL ) 10 MG tablet; Take 1 tablet (10 mg total) by mouth 3 (three) times daily.  Dispense: 30 each; Refill: 0 - meclizine  (ANTIVERT ) 50 MG tablet; Take 1 tablet (50 mg total) by mouth 2 (two) times daily as needed for dizziness.  Dispense: 30 tablet; Refill: 0 - If symptoms continue despite current therapy follow-up with neurology for further evaluation of persistent headaches.  -Continue to monitor symptoms for any change in severity if there is any escalation of current symptoms or development of new symptoms follow-up in ER for further evaluation and management.

## 2024-10-02 ENCOUNTER — Emergency Department (HOSPITAL_COMMUNITY)
Admission: EM | Admit: 2024-10-02 | Discharge: 2024-10-03 | Disposition: A | Discharge status: Home / Self Care | Attending: Emergency Medicine | Admitting: Emergency Medicine

## 2024-10-02 ENCOUNTER — Emergency Department (HOSPITAL_COMMUNITY)

## 2024-10-02 ENCOUNTER — Encounter (HOSPITAL_COMMUNITY): Payer: Self-pay | Admitting: *Deleted

## 2024-10-02 ENCOUNTER — Other Ambulatory Visit: Payer: Self-pay

## 2024-10-02 DIAGNOSIS — R519 Headache, unspecified: Secondary | ICD-10-CM | POA: Diagnosis present

## 2024-10-02 DIAGNOSIS — G43809 Other migraine, not intractable, without status migrainosus: Secondary | ICD-10-CM | POA: Diagnosis not present

## 2024-10-02 LAB — CBC WITH DIFFERENTIAL/PLATELET
Abs Immature Granulocytes: 0.02 K/uL (ref 0.00–0.07)
Basophils Absolute: 0 K/uL (ref 0.0–0.1)
Basophils Relative: 0 %
Eosinophils Absolute: 0.1 K/uL (ref 0.0–0.5)
Eosinophils Relative: 1 %
HCT: 32.2 % — ABNORMAL LOW (ref 36.0–46.0)
Hemoglobin: 9.4 g/dL — ABNORMAL LOW (ref 12.0–15.0)
Immature Granulocytes: 0 %
Lymphocytes Relative: 39 %
Lymphs Abs: 3.8 K/uL (ref 0.7–4.0)
MCH: 23.7 pg — ABNORMAL LOW (ref 26.0–34.0)
MCHC: 29.2 g/dL — ABNORMAL LOW (ref 30.0–36.0)
MCV: 81.1 fL (ref 80.0–100.0)
Monocytes Absolute: 0.4 K/uL (ref 0.1–1.0)
Monocytes Relative: 5 %
Neutro Abs: 5.3 K/uL (ref 1.7–7.7)
Neutrophils Relative %: 55 %
Platelets: 434 K/uL — ABNORMAL HIGH (ref 150–400)
RBC: 3.97 MIL/uL (ref 3.87–5.11)
RDW: 18.9 % — ABNORMAL HIGH (ref 11.5–15.5)
WBC: 9.7 K/uL (ref 4.0–10.5)
nRBC: 0 % (ref 0.0–0.2)

## 2024-10-02 LAB — URINALYSIS, ROUTINE W REFLEX MICROSCOPIC
Bilirubin Urine: NEGATIVE
Glucose, UA: NEGATIVE mg/dL
Hgb urine dipstick: NEGATIVE
Ketones, ur: NEGATIVE mg/dL
Nitrite: NEGATIVE
Protein, ur: NEGATIVE mg/dL
Specific Gravity, Urine: 1.009 (ref 1.005–1.030)
pH: 6 (ref 5.0–8.0)

## 2024-10-02 LAB — BASIC METABOLIC PANEL WITH GFR
Anion gap: 10 (ref 5–15)
BUN: 6 mg/dL (ref 6–20)
CO2: 23 mmol/L (ref 22–32)
Calcium: 8.7 mg/dL — ABNORMAL LOW (ref 8.9–10.3)
Chloride: 106 mmol/L (ref 98–111)
Creatinine, Ser: 0.73 mg/dL (ref 0.44–1.00)
GFR, Estimated: >60 mL/min (ref >60)
Glucose, Bld: 82 mg/dL (ref 70–99)
Potassium: 3.5 mmol/L (ref 3.5–5.1)
Sodium: 139 mmol/L (ref 135–145)

## 2024-10-02 LAB — PREGNANCY, URINE: Preg Test, Ur: NEGATIVE

## 2024-10-02 MED ORDER — OXYCODONE-ACETAMINOPHEN 5-325 MG PO TABS
1.0000 | ORAL_TABLET | Freq: Once | ORAL | Status: AC
  Administered 2024-10-02: 1 via ORAL
  Filled 2024-10-02: qty 1

## 2024-10-02 MED ORDER — LACTATED RINGERS IV BOLUS
1000.0000 mL | Freq: Once | INTRAVENOUS | Status: AC
  Administered 2024-10-02: 1000 mL via INTRAVENOUS

## 2024-10-02 MED ORDER — ONDANSETRON 4 MG PO TBDP
8.0000 mg | ORAL_TABLET | Freq: Once | ORAL | Status: AC
  Administered 2024-10-02: 8 mg via ORAL
  Filled 2024-10-02: qty 2

## 2024-10-02 MED ORDER — PROCHLORPERAZINE EDISYLATE 10 MG/2ML IJ SOLN
10.0000 mg | Freq: Once | INTRAMUSCULAR | Status: AC
  Administered 2024-10-02: 10 mg via INTRAVENOUS
  Filled 2024-10-02: qty 2

## 2024-10-02 MED ORDER — IOHEXOL 350 MG/ML SOLN
75.0000 mL | Freq: Once | INTRAVENOUS | Status: AC | PRN
  Administered 2024-10-02: 75 mL via INTRAVENOUS

## 2024-10-02 MED ORDER — ONDANSETRON HCL 4 MG/2ML IJ SOLN
4.0000 mg | Freq: Once | INTRAMUSCULAR | Status: DC
  Filled 2024-10-02: qty 2

## 2024-10-02 MED ORDER — DROPERIDOL 2.5 MG/ML IJ SOLN: 2.5000 mg | Freq: Once | INTRAMUSCULAR | Status: DC

## 2024-10-02 MED ORDER — DIPHENHYDRAMINE HCL 50 MG/ML IJ SOLN
50.0000 mg | Freq: Once | INTRAMUSCULAR | Status: AC
  Administered 2024-10-02: 50 mg via INTRAVENOUS
  Filled 2024-10-02: qty 1

## 2024-10-02 NOTE — ED Notes (Signed)
 Patient transported to CT

## 2024-10-02 NOTE — ED Provider Triage Note (Addendum)
 Emergency Medicine Provider Triage Evaluation Note  Rachael Moyer , a 48 y.o. female  was evaluated in triage.  Pt complains of headache since Tuesday.  History of same.  Reports similar to previous.  Review of Systems  Positive: Headache, nausea, phonophobia Negative: Photophobia, diplopia, tinnitus, fever,  Physical Exam  BP (!) 159/97   Pulse 67   Temp 97.9 F (36.6 C)   Resp 15   Ht 5' 3 (1.6 m)   Wt 97 kg   LMP 09/27/2024 (Approximate)   SpO2 100%   BMI 37.88 kg/m  Gen:   Awake, no distress   Resp:  Normal effort  MSK:   Moves extremities without difficulty  Other:  No meningismus  Medical Decision Making  Medically screening exam initiated at 3:51 PM.  Appropriate orders placed.  Rachael Moyer was informed that the remainder of the evaluation will be completed by another provider, this initial triage assessment does not replace that evaluation, and the importance of remaining in the ED until their evaluation is complete.  Labs ordered   Francis Ileana SAILOR, PA-C 10/02/24 1553    Francis Ileana SAILOR, PA-C 10/02/24 1553

## 2024-10-02 NOTE — ED Triage Notes (Signed)
 Pt c/o chronic headaches, which have flared since Tuesday. Pt endorses vision changes. No unilateral weakness noted by first nurse in waiting room.

## 2024-10-02 NOTE — ED Provider Notes (Incomplete)
  EMERGENCY DEPARTMENT AT [facility] Provider Note   CSN: [id number] Arrival date & time: 10/02/24  1540     Patient presents with: Headache   Rachael Moyer is a 48 y.o. female.  {Add pertinent medical, surgical, social history, OB history to HPI:32947} History of anxiety, migraines, GERD, tension headaches presenting with significant headache for the past 2 days.  History per patient.  Reports she developed sudden onset headache 2 nights ago, primarily in the back of her head and extending forward.  She states she has a history of migraines, and this feels very different.  She endorses nausea without episodes of vomiting.  Denies fever or chills.  Difficulty with sleeping, denies any head injury, denies syncope or near syncope.  Denies chest pain or shortness of breath.  Also states she has a history of tension headaches and states this feels very different as well.   Headache      Prior to Admission medications   Medication Sig Start Date End Date Taking? Authorizing Provider  atorvastatin (LIPITOR) 40 MG tablet Take 40 mg by mouth at bedtime.    [provider]  azelastine  (ASTELIN ) 0.1 % nasal spray Place 1 spray into both nostrils 2 (two) times daily. Use in each nostril as directed 02/07/24   Reddick, Johnathan B, NP  baclofen  (LIORESAL ) 10 MG tablet Take 1 tablet (10 mg total) by mouth 3 (three) times daily. 09/30/24   Reddick, Johnathan B, NP  diclofenac  (VOLTAREN ) 50 MG EC tablet Take 1 tablet (50 mg total) by mouth 2 (two) times daily. 09/30/24   Reddick, Johnathan B, NP  dicyclomine  (BENTYL ) 20 MG tablet Take 1 tablet (20 mg total) by mouth 2 (two) times daily. 08/23/23   Hildegard Loge, PA-C  EXCEDRIN TENSION HEADACHE 500-65 MG TABS Take 1-2 tablets by mouth every 6 (six) hours as needed (for headaches).    [provider]  ibuprofen  (ADVIL ) 800 MG tablet Take 1 tablet (800 mg total) by mouth 3 (three) times daily. 02/07/24   Reddick,  Johnathan B, NP  meclizine  (ANTIVERT ) 50 MG tablet Take 1 tablet (50 mg total) by mouth 2 (two) times daily as needed for dizziness. 09/30/24   Reddick, Johnathan B, NP  methocarbamol  (ROBAXIN ) 500 MG tablet Take 1 tablet (500 mg total) by mouth every 8 (eight) hours as needed for muscle spasms. 12/24/22   Rush Nest, MD  nortriptyline  (PAMELOR ) 10 MG capsule Take 1 pill at bedtime for 1 week, then increase to 2 pills at bedtime 12/24/22   Rush Nest, MD  ondansetron  (ZOFRAN -ODT) 8 MG disintegrating tablet Take 1 tablet (8 mg total) by mouth every 8 (eight) hours as needed for nausea or vomiting. 12/24/22   Rush Nest, MD  pantoprazole  (PROTONIX ) 40 MG tablet Take 40 mg by mouth daily.    [provider]    Allergies: Patient has no known allergies.    Review of Systems  Neurological:  Positive for headaches.    Updated Vital Signs BP (!) 149/83 (BP Location: Left Arm)   Pulse (!) 55   Temp 98.3 F (36.8 C)   Resp 18   Ht 5' 3 (1.6 m)   Wt 97 kg   LMP 09/27/2024 (Approximate)   SpO2 98%   BMI 37.88 kg/m   Physical Exam Vitals and nursing note reviewed.  Constitutional:      General: She is in acute distress.     Appearance: She is well-developed. She is not ill-appearing.  HENT:  Head: Normocephalic and atraumatic.     Mouth/Throat:     Mouth: Mucous membranes are moist.  Eyes:     General: No visual field deficit.    Extraocular Movements: Extraocular movements intact.     Conjunctiva/sclera: Conjunctivae normal.     Pupils: Pupils are equal, round, and reactive to light.  Cardiovascular:     Rate and Rhythm: Normal rate and regular rhythm.     Heart sounds: No murmur heard. Pulmonary:     Effort: Pulmonary effort is normal. No respiratory distress.     Breath sounds: Normal breath sounds.  Abdominal:     Palpations: Abdomen is soft.     Tenderness: There is no abdominal tenderness.  Musculoskeletal:        General: No swelling.      Cervical back: Neck supple.  Skin:    General: Skin is warm and dry.     Capillary Refill: Capillary refill takes less than 2 seconds.  Neurological:     Mental Status: She is alert and oriented to person, place, and time. Mental status is at baseline.     GCS: GCS eye subscore is 4. GCS verbal subscore is 5. GCS motor subscore is 6.     Cranial Nerves: No cranial nerve deficit, dysarthria or facial asymmetry.     Sensory: No sensory deficit.     Motor: No weakness.     Coordination: Romberg sign negative. Coordination normal.     Gait: Gait normal.  Psychiatric:        Mood and Affect: Mood normal.     (all labs ordered are listed, but only abnormal results are displayed) Labs Reviewed  BASIC METABOLIC PANEL WITH GFR - Abnormal; Notable for the following components:      Result Value   Calcium 8.7 (*)    All other components within normal limits  CBC WITH DIFFERENTIAL/PLATELET - Abnormal; Notable for the following components:   Hemoglobin 9.4 (*)    HCT 32.2 (*)    MCH 23.7 (*)    MCHC 29.2 (*)    RDW 18.9 (*)    Platelets 434 (*)    All other components within normal limits  URINALYSIS, ROUTINE W REFLEX MICROSCOPIC - Abnormal; Notable for the following components:   Leukocytes,Ua SMALL (*)    Bacteria, UA RARE (*)    All other components within normal limits  PREGNANCY, URINE    EKG: None  Radiology: No results found.  {Document cardiac monitor, telemetry assessment procedure when appropriate:32947} Procedures   Medications Ordered in the ED  prochlorperazine  (COMPAZINE ) injection 10 mg (has no administration in time range)  diphenhydrAMINE  (BENADRYL ) injection 50 mg (has no administration in time range)  lactated ringers  bolus 1,000 mL (has no administration in time range)  oxyCODONE -acetaminophen  (PERCOCET/ROXICET) 5-325 MG per tablet 1 tablet (1 tablet Oral Given 10/02/24 1603)  ondansetron  (ZOFRAN -ODT) disintegrating tablet 8 mg (8 mg Oral Given 10/02/24  1603)      {Click here for ABCD2, HEART and other calculators REFRESH Note before signing:1}                              Medical Decision Making Amount and/or Complexity of Data Reviewed Radiology: ordered.  Risk Prescription drug management.   ***  {Document critical care time when appropriate  Document review of labs and clinical decision tools ie CHADS2VASC2, etc  Document your independent review of radiology images and any outside  records  Document your discussion with family members, caretakers and with consultants  Document social determinants of health affecting pt's care  Document your decision making why or why not admission, treatments were needed:32947:::1}   Final diagnoses:  None    ED Discharge Orders     None

## 2024-11-18 ENCOUNTER — Ambulatory Visit (HOSPITAL_COMMUNITY): Admission: EM | Admit: 2024-11-18 | Discharge: 2024-11-18 | Disposition: A | Discharge status: Home / Self Care

## 2024-11-18 ENCOUNTER — Encounter (HOSPITAL_COMMUNITY): Payer: Self-pay

## 2024-11-18 DIAGNOSIS — K0889 Other specified disorders of teeth and supporting structures: Secondary | ICD-10-CM | POA: Diagnosis not present

## 2024-11-18 MED ORDER — HYDROCODONE-ACETAMINOPHEN 5-325 MG PO TABS: 1.0000 | ORAL_TABLET | Freq: Three times a day (TID) | ORAL | 0 refills | Status: AC | PRN

## 2024-11-18 MED ORDER — HYDROCODONE-ACETAMINOPHEN 5-325 MG PO TABS
1.0000 | ORAL_TABLET | Freq: Once | ORAL | Status: AC
  Administered 2024-11-18: 1 via ORAL

## 2024-11-18 MED ORDER — HYDROCODONE-ACETAMINOPHEN 5-325 MG PO TABS
ORAL_TABLET | ORAL | Status: AC
  Filled 2024-11-18: qty 1

## 2024-11-18 NOTE — ED Provider Notes (Signed)
 " [facility]    CSN: [id number] Arrival date & time: 11/18/24  1445      History   Chief Complaint Chief Complaint  Patient presents with   Dental Pain    HPI Rachael Moyer is a 49 y.o. female.   This 49 year old female is being seen for left lower dental pain.  She had a tooth extracted on Monday.  She reports she felt okay the remainder of Monday.  Yesterday she began having increasing pain.  Today she reports the pain is severe and makes her nauseous.  She works for [employer] and is on the telephone for most of her workday.  She reports the more she has to move her mouth such as with talking, the more pain she has.  She is currently taking amoxicillin  to prevent infection.  She reports compliance with this medication.  She says she called the dentist to report her pain levels and they advised her to take Tylenol  and ibuprofen .  She reports she is taking these with no significant relief of symptoms.  She denies any bleeding or drainage from the extraction site.  She denies headache, dizziness.  She denies fever, chills.  She denies abdominal pain, vomiting, diarrhea.   Dental Pain Associated symptoms: no fever and no headaches     Past Medical History:  Diagnosis Date   Anxiety    Bunion    Gastric ulcer    GERD (gastroesophageal reflux disease)    Headache    Irritable bowel     Patient Active Problem List   Diagnosis Date Noted   GERD (gastroesophageal reflux disease) 08/30/2021   Migraine 08/30/2021   Abdominal cramping 08/14/2017   Pap smear for cervical cancer screening 08/14/2017   Generalized anxiety disorder 05/16/2017   Bunion     Past Surgical History:  Procedure Laterality Date   FOOT SURGERY     VENTRAL HERNIA REPAIR N/A 07/04/2020   Procedure: VENTRAL HERNIA REPAIR WITH MESH;  Surgeon: Vernetta Berg, MD;  Location: [facility];  Service: General;  Laterality: N/A;  LMA    OB History   No obstetric history on file.       Home Medications    Prior to Admission medications  Medication Sig Start Date End Date Taking? Authorizing Provider  HYDROcodone -acetaminophen  (NORCO/VICODIN) 5-325 MG tablet Take 1 tablet by mouth every 8 (eight) hours as needed for up to 2 days. 11/18/24 11/20/24 Yes Pierce Barocio C, FNP  nortriptyline  (PAMELOR ) 10 MG capsule Take 1 pill at bedtime for 1 week, then increase to 2 pills at bedtime 12/24/22   Rush Nest, MD  ondansetron  (ZOFRAN -ODT) 8 MG disintegrating tablet Take 1 tablet (8 mg total) by mouth every 8 (eight) hours as needed for nausea or vomiting. 12/24/22   Rush Nest, MD    Family History Family History  Adopted: Yes    Social History Social History[1]   Allergies   Patient has no known allergies.   Review of Systems Review of Systems  Constitutional:  Positive for appetite change. Negative for activity change, chills and fever.  HENT:  Positive for dental problem.   Respiratory:  Negative for shortness of breath.   Cardiovascular:  Negative for chest pain.  Gastrointestinal:  Positive for nausea. Negative for abdominal pain, diarrhea and vomiting.  Skin:  Negative for color change and rash.  Neurological:  Negative for dizziness and headaches.  All other systems reviewed and are negative.    Physical Exam Triage Vital Signs  ED Triage Vitals  Encounter Vitals Group     BP 11/18/24 1624 (!) 162/91     Girls Systolic BP Percentile --      Girls Diastolic BP Percentile --      Boys Systolic BP Percentile --      Boys Diastolic BP Percentile --      Pulse Rate 11/18/24 1624 67     Resp 11/18/24 1624 18     Temp 11/18/24 1624 98.5 F (36.9 C)     Temp src --      SpO2 11/18/24 1624 98 %     Weight --      Height --      Head Circumference --      Peak Flow --      Pain Score 11/18/24 1622 10     Pain Loc --      Pain Education --      Exclude from Growth Chart --    No data found.  Updated Vital Signs BP (!) 162/91 (BP Location:  Left Arm)   Pulse 67   Temp 98.5 F (36.9 C)   Resp 18   LMP 11/14/2024 (Exact Date)   SpO2 98%   Visual Acuity Right Eye Distance:   Left Eye Distance:   Bilateral Distance:    Right Eye Near:   Left Eye Near:    Bilateral Near:     Physical Exam Vitals and nursing note reviewed.  Constitutional:      General: She is not in acute distress.    Appearance: She is well-developed. She is not ill-appearing or toxic-appearing.     Comments: Pleasant female appearing stated age found sitting in chair in no acute distress.  HENT:     Head: Normocephalic and atraumatic.     Right Ear: Tympanic membrane and external ear normal.     Left Ear: Tympanic membrane and external ear normal.     Nose: Congestion present.     Mouth/Throat:     Lips: Pink.     Mouth: Mucous membranes are moist.     Dentition: Abnormal dentition. Dental tenderness present. No dental abscesses.     Comments: Tooth extraction site is unremarkable.  Small blood clot observed at extraction site.  No surrounding erythema, bleeding, drainage observed. Eyes:     Conjunctiva/sclera: Conjunctivae normal.  Cardiovascular:     Rate and Rhythm: Normal rate and regular rhythm.     Heart sounds: Normal heart sounds. No murmur heard. Pulmonary:     Effort: Pulmonary effort is normal. No respiratory distress.     Breath sounds: Normal breath sounds.  Abdominal:     General: Bowel sounds are normal.     Palpations: Abdomen is soft.     Tenderness: There is no abdominal tenderness.  Musculoskeletal:     Cervical back: Neck supple.  Skin:    General: Skin is warm and dry.  Neurological:     Mental Status: She is alert.  Psychiatric:        Mood and Affect: Mood normal.      UC Treatments / Results  Labs (all labs ordered are listed, but only abnormal results are displayed) Labs Reviewed - No data to display  EKG   Radiology No results found.  Procedures Procedures (including critical care  time)  Medications Ordered in UC Medications  HYDROcodone -acetaminophen  (NORCO/VICODIN) 5-325 MG per tablet 1 tablet (has no administration in time range)    Initial Impression / Assessment and  Plan / UC Course  I have reviewed the triage vital signs and the nursing notes.  Pertinent labs & imaging results that were available during my care of the patient were reviewed by me and considered in my medical decision making (see chart for details).     Dose and triage reviewed, patient is hemodynamically stable.  She reports she has a, but is having significant pain.  After verifying she is not driving, she is given a Norco tablet in clinic today.  She is given a very short course of Norco tablets for home use.  Advised ice packs.  Advised to continue with antibiotic, ibuprofen , and mouth rinse as prescribed by dentist.  Advised to follow-up with dentist if pain continues or worsens.  Plan of care, follow-up care, return precautions given, no questions at this time. Final Clinical Impressions(s) / UC Diagnoses   Final diagnoses:  Pain, dental     Discharge Instructions      You are having pain at the tooth extraction site. The site does not look infected.  There is no drainage or bleeding observed.  You have been given a prescription for Norco tablets.  Take 1 tablet every 8 hours as needed for severe pain.  This medication can make you sleepy.  Please do not drive or operate machinery while using.  Continue with ibuprofen , mouth rinse, antibiotic as prescribed by your dentist.  Apply ice packs to the left side of your jaw on for 15-20 minutes at a time several times a day.  Eat foods that are soft and easy to swallow.  If you continue to have severe pain, please follow-up with your dentist.  If you develop any new or worsening symptoms or if your symptoms do not start to improve, return here or follow-up with your primary care provider.  If your symptoms are severe, please go to the  emergency room.     ED Prescriptions     Medication Sig Dispense Auth. Provider   HYDROcodone -acetaminophen  (NORCO/VICODIN) 5-325 MG tablet Take 1 tablet by mouth every 8 (eight) hours as needed for up to 2 days. 6 tablet May Ozment C, FNP      I have reviewed the PDMP during this encounter.    [1]  Social History Tobacco Use   Smoking status: Former    Types: Cigarettes   Smokeless tobacco: Never  Vaping Use   Vaping status: Never Used  Substance Use Topics   Alcohol use: Not Currently    Comment: 1/month   Drug use: Yes     Lennice Jon BROCKS, FNP 11/18/24 1707  "

## 2024-11-18 NOTE — ED Triage Notes (Signed)
 Pt states had a tooth removed from lt lower on Monday. States taken tylenol  and ibuprofen  with no relief. States pain radiating to lt ear and causing nausea.

## 2024-11-18 NOTE — Discharge Instructions (Signed)
 You are having pain at the tooth extraction site. The site does not look infected.  There is no drainage or bleeding observed.  You have been given a prescription for Norco tablets.  Take 1 tablet every 8 hours as needed for severe pain.  This medication can make you sleepy.  Please do not drive or operate machinery while using.  Continue with ibuprofen , mouth rinse, antibiotic as prescribed by your dentist.  Apply ice packs to the left side of your jaw on for 15-20 minutes at a time several times a day.  Eat foods that are soft and easy to swallow.  If you continue to have severe pain, please follow-up with your dentist.  If you develop any new or worsening symptoms or if your symptoms do not start to improve, return here or follow-up with your primary care provider.  If your symptoms are severe, please go to the emergency room.
